# Patient Record
Sex: Female | Born: 1935 | Race: White | Hispanic: No | Smoking: Never smoker
Health system: Southern US, Community
[De-identification: ages and names within clinical notes are randomized; demographics above are authoritative.]

## PROBLEM LIST (undated history)

## (undated) DIAGNOSIS — I10 Essential (primary) hypertension: Secondary | ICD-10-CM

## (undated) DIAGNOSIS — E875 Hyperkalemia: Secondary | ICD-10-CM

## (undated) DIAGNOSIS — M1990 Unspecified osteoarthritis, unspecified site: Secondary | ICD-10-CM

## (undated) DIAGNOSIS — F0280 Dementia in other diseases classified elsewhere without behavioral disturbance: Secondary | ICD-10-CM

## (undated) DIAGNOSIS — G309 Alzheimer's disease, unspecified: Secondary | ICD-10-CM

## (undated) DIAGNOSIS — E785 Hyperlipidemia, unspecified: Secondary | ICD-10-CM

## (undated) DIAGNOSIS — K589 Irritable bowel syndrome without diarrhea: Secondary | ICD-10-CM

## (undated) HISTORY — PX: CARDIAC CATHETERIZATION: SHX172

## (undated) HISTORY — PX: ABDOMINAL HYSTERECTOMY: SHX81

---

## 2000-12-01 ENCOUNTER — Ambulatory Visit (HOSPITAL_COMMUNITY): Admission: RE | Admit: 2000-12-01 | Discharge: 2000-12-01 | Payer: Self-pay | Admitting: Ophthalmology

## 2000-12-01 ENCOUNTER — Encounter: Payer: Self-pay | Admitting: Ophthalmology

## 2001-03-18 ENCOUNTER — Encounter: Payer: Self-pay | Admitting: Neurosurgery

## 2001-03-18 ENCOUNTER — Encounter: Admission: RE | Admit: 2001-03-18 | Discharge: 2001-03-18 | Payer: Self-pay | Admitting: Neurosurgery

## 2001-03-28 ENCOUNTER — Ambulatory Visit (HOSPITAL_COMMUNITY): Admission: RE | Admit: 2001-03-28 | Discharge: 2001-03-28 | Payer: Self-pay | Admitting: Ophthalmology

## 2001-08-16 ENCOUNTER — Ambulatory Visit (HOSPITAL_COMMUNITY): Admission: RE | Admit: 2001-08-16 | Discharge: 2001-08-16 | Payer: Self-pay | Admitting: Ophthalmology

## 2001-09-13 ENCOUNTER — Ambulatory Visit (HOSPITAL_COMMUNITY): Admission: RE | Admit: 2001-09-13 | Discharge: 2001-09-13 | Payer: Self-pay | Admitting: Ophthalmology

## 2002-05-05 ENCOUNTER — Inpatient Hospital Stay (HOSPITAL_COMMUNITY): Admission: EM | Admit: 2002-05-05 | Discharge: 2002-05-10 | Payer: Self-pay | Admitting: Cardiology

## 2002-05-09 ENCOUNTER — Encounter: Payer: Self-pay | Admitting: Cardiology

## 2005-08-31 ENCOUNTER — Ambulatory Visit (HOSPITAL_COMMUNITY): Admission: RE | Admit: 2005-08-31 | Discharge: 2005-08-31 | Payer: Self-pay | Admitting: Ophthalmology

## 2006-07-02 ENCOUNTER — Ambulatory Visit: Payer: Self-pay | Admitting: Cardiology

## 2009-11-09 ENCOUNTER — Emergency Department (HOSPITAL_COMMUNITY): Admission: EM | Admit: 2009-11-09 | Discharge: 2009-11-09 | Payer: Self-pay | Admitting: Emergency Medicine

## 2009-11-11 ENCOUNTER — Emergency Department (HOSPITAL_COMMUNITY): Admission: EM | Admit: 2009-11-11 | Discharge: 2009-11-11 | Payer: Self-pay | Admitting: Emergency Medicine

## 2010-09-30 LAB — URINALYSIS, ROUTINE W REFLEX MICROSCOPIC
Bilirubin Urine: NEGATIVE
Glucose, UA: NEGATIVE mg/dL
Nitrite: NEGATIVE
Specific Gravity, Urine: 1.01 (ref 1.005–1.030)
pH: 6.5 (ref 5.0–8.0)

## 2010-09-30 LAB — URINE MICROSCOPIC-ADD ON

## 2010-09-30 LAB — GLUCOSE, CAPILLARY: Glucose-Capillary: 180 mg/dL — ABNORMAL HIGH (ref 70–99)

## 2010-10-10 ENCOUNTER — Emergency Department (HOSPITAL_COMMUNITY): Payer: Medicare Other

## 2010-10-10 ENCOUNTER — Emergency Department (HOSPITAL_COMMUNITY)
Admission: EM | Admit: 2010-10-10 | Discharge: 2010-10-10 | Disposition: A | Discharge status: Home / Self Care | Payer: Medicare Other | Attending: Emergency Medicine | Admitting: Emergency Medicine

## 2010-10-10 DIAGNOSIS — R112 Nausea with vomiting, unspecified: Secondary | ICD-10-CM | POA: Insufficient documentation

## 2010-10-10 DIAGNOSIS — R9431 Abnormal electrocardiogram [ECG] [EKG]: Secondary | ICD-10-CM | POA: Insufficient documentation

## 2010-10-10 LAB — COMPREHENSIVE METABOLIC PANEL
AST: 17 U/L (ref 0–37)
Albumin: 3.6 g/dL (ref 3.5–5.2)
Calcium: 9.2 mg/dL (ref 8.4–10.5)
Creatinine, Ser: 0.62 mg/dL (ref 0.4–1.2)
GFR calc Af Amer: >60 mL/min (ref >60)
Sodium: 135 mEq/L (ref 135–145)

## 2010-10-10 LAB — DIFFERENTIAL
Basophils Absolute: 0 10*3/uL (ref 0.0–0.1)
Basophils Relative: 1 % (ref 0–1)
Eosinophils Absolute: 0 10*3/uL (ref 0.0–0.7)
Monocytes Absolute: 0.6 10*3/uL (ref 0.1–1.0)
Monocytes Relative: 10 % (ref 3–12)
Neutrophils Relative %: 76 % (ref 43–77)

## 2010-10-10 LAB — CBC
MCH: 32.3 pg (ref 26.0–34.0)
MCHC: 33.8 g/dL (ref 30.0–36.0)
Platelets: 271 10*3/uL (ref 150–400)
RBC: 3.56 MIL/uL — ABNORMAL LOW (ref 3.87–5.11)

## 2010-10-10 LAB — POCT CARDIAC MARKERS
CKMB, poc: <1 ng/mL — ABNORMAL LOW (ref 1.0–8.0)
Troponin i, poc: <0.05 ng/mL (ref 0.00–0.09)

## 2010-10-28 ENCOUNTER — Emergency Department (HOSPITAL_COMMUNITY)
Admission: EM | Admit: 2010-10-28 | Discharge: 2010-10-28 | Disposition: A | Discharge status: Home / Self Care | Payer: Medicare Other | Attending: Emergency Medicine | Admitting: Emergency Medicine

## 2010-10-28 ENCOUNTER — Emergency Department (HOSPITAL_COMMUNITY): Payer: Medicare Other

## 2010-10-28 DIAGNOSIS — E119 Type 2 diabetes mellitus without complications: Secondary | ICD-10-CM | POA: Insufficient documentation

## 2010-10-28 DIAGNOSIS — R0602 Shortness of breath: Secondary | ICD-10-CM | POA: Insufficient documentation

## 2010-10-28 DIAGNOSIS — R05 Cough: Secondary | ICD-10-CM | POA: Insufficient documentation

## 2010-10-28 DIAGNOSIS — J209 Acute bronchitis, unspecified: Secondary | ICD-10-CM | POA: Insufficient documentation

## 2010-10-28 DIAGNOSIS — E785 Hyperlipidemia, unspecified: Secondary | ICD-10-CM | POA: Insufficient documentation

## 2010-10-28 DIAGNOSIS — I10 Essential (primary) hypertension: Secondary | ICD-10-CM | POA: Insufficient documentation

## 2010-10-28 DIAGNOSIS — Z79899 Other long term (current) drug therapy: Secondary | ICD-10-CM | POA: Insufficient documentation

## 2010-10-28 DIAGNOSIS — R059 Cough, unspecified: Secondary | ICD-10-CM | POA: Insufficient documentation

## 2010-10-28 LAB — CBC
HCT: 32 % — ABNORMAL LOW (ref 36.0–46.0)
Hemoglobin: 10.7 g/dL — ABNORMAL LOW (ref 12.0–15.0)
RBC: 3.34 MIL/uL — ABNORMAL LOW (ref 3.87–5.11)
RDW: 13.2 % (ref 11.5–15.5)
WBC: 3.1 10*3/uL — ABNORMAL LOW (ref 4.0–10.5)

## 2010-10-28 LAB — BASIC METABOLIC PANEL
CO2: 21 mEq/L (ref 19–32)
GFR calc non Af Amer: >60 mL/min (ref >60)
Glucose, Bld: 115 mg/dL — ABNORMAL HIGH (ref 70–99)
Potassium: 3.5 mEq/L (ref 3.5–5.1)
Sodium: 136 mEq/L (ref 135–145)

## 2010-10-28 LAB — CK TOTAL AND CKMB (NOT AT ARMC): Relative Index: 0.8 (ref 0.0–2.5)

## 2010-10-28 LAB — DIFFERENTIAL
Basophils Absolute: 0 10*3/uL (ref 0.0–0.1)
Lymphocytes Relative: 30 % (ref 12–46)
Neutro Abs: 1.8 10*3/uL (ref 1.7–7.7)
Neutrophils Relative %: 59 % (ref 43–77)

## 2010-10-28 LAB — INFLUENZA PANEL BY PCR (TYPE A & B)

## 2010-10-28 LAB — TROPONIN I: Troponin I: 0.01 ng/mL (ref 0.00–0.06)

## 2010-11-28 NOTE — Discharge Summary (Signed)
NAME:  Michelle Sweeney, Michelle Sweeney                          ACCOUNT NO.:  [id number]   MEDICAL RECORD NO.:  [id number]                   PATIENT TYPE:  INP   LOCATION:  3715                                 FACILITY:  [facility]   PHYSICIAN:  Lavella Hammock, P.A. [facility]            DATE OF BIRTH:  May 09, 1933   DATE OF ADMISSION:  05/05/2002  DATE OF DISCHARGE:  05/10/2002                           DISCHARGE SUMMARY - REFERRING   PROCEDURES:  1. Cardiac catheterization.  2. Coronary arteriogram.  3. Left ventriculogram.  4. Adenosine Cardiolite.   HOSPITAL COURSE:  The patient is a 77 year old female who had a negative  nuclear study in 1995, but who has multiple cardiac risk factors including  hypertension and hyperlipidemia and diabetes. She went to [facility] on May 04, 2002, for chest discomfort that had been worse over  the last 24 to 48 hours and began with exertion. She had been admitted there  to rule out MI and for further evaluation. Her cardiac consult was called as  well. Her symptoms were suspicious for coronary  ischemia, and it was felt  that she needed a cardiac catheterization to define her anatomy. Because of  this she was transferred to [facility] for further evaluation and  treatment.   Her enzymes were negative for MI and she had a cardiac catheterization  performed on May 09, 2002. The cardiac catheterization showed a left  main that was normal and an LAD that was heavily calcified with a proximal  60% to 70% stenosis. The circumflex was dominant with mild irregularities  and the RCA was nondominant with no critical disease. The films were  reviewed by Dr. Riley Kill and there was concern for ischemia from the LAD  region, and so an adenosine Cardiolite was ordered.   On May 09, 2002,  the patient had an adenosine Cardiolite. The  Cardiolite showed an EF of 78% with no ischemia and no scar. It was felt  that medical therapy was then best, and  she has had no further episodes of  chest pain with ambulation. A cholesterol profile was checked and it showed  an LDL of 128. Because of this she was started on  a statin.   The patient had been on insulin prior to  admission, but there was concern  for proper technique, so she was seen and proper technique was reviewed and  demonstrated. The patient demonstrated understanding.   On May 10, 2002, the patient was ambulating without difficulty and  followup had been arranged. She was considered suitable for discharge on  May 10, 2002.   LABORATORY DATA:  Hemoglobin 13.6, hematocrit 40.3, WBCs 6.2, platelets 304.  Glucose 181 fasting. Sodium 140, potassium 4.3, chloride 103, CO2 25, BUN  13, creatinine 0.7.  A lipid profile drawn at [facility] showed  triglycerides 130, total cholesterol 220, HDL 66, LDL 128.   CONDITION ON DISCHARGE:  Improved  DISCHARGE DIAGNOSES:  1. Chest pain, possible anginal pain, medical therapy recommended.  2. Coronary artery disease, 60% to 70% left anterior descending artery with     no ischemia by Cardiolite. No other critical disease by catheterization     this admission.  3. Insulin dependent diabetes mellitus.  4. History of allergy to penicillin.  5. Hypertension.  6. Hypertension.  7. Hyperlipidemia.  8. Status post hysterectomy.  9. History of paroxysmal atrial fibrillation and flutter.  10.      Hypokalemia.   DISCHARGE INSTRUCTIONS:  Her activity level is to include no driving, sexual  activity or strenuous activity for two days. After that she is to walk five  minutes twice a day starting Saturday and increase p.r.n. She is to stick to  a low fat, low salt diabetic diet. She is to call  the office for problems  with the catheter site.   FOLLOW UP:  She is to follow up with Dr. Myrtis Ser and the office will call. She  is to follow up with Dr. Orvan Falconer and keep appointments as scheduled with  him. The patient is to have  laboratory work drawn at Dr. Daleen Bo office on  May 15, 2002. At that time she is to obtain a BMET to assess for ongoing  hypokalemia.   DISCHARGE MEDICATIONS:  1. Insulin 70/30, 12 units at breakfast and lunch and 10 units q. p.m.  2. Enteric coated aspirin 325 mg q.d.  3. Atenolol 12.5 mg which is 1/2 of a 25 mg tablet b.i.d.  4. Altace 5 mg q.d.  5. Tiazac 180 mg q.d.  6. Lasix 40 mg q.d.  7. Zocor 20 mg q.d.  8. Nitroglycerin p.r.n.                                                   Lavella Hammock, P.A. [facility]    RG/MEDQ  D:  05/10/2002  T:  05/10/2002  Job:  [id number]   cc:   [name]  [address] [place]  [place] [address]  Fax: [phone]

## 2010-11-28 NOTE — Op Note (Signed)
[place]  Patient:    Michelle Sweeney, Michelle Sweeney                       MRN: [id number] Proc. Date: 12/01/00 Adm. Date:  [id number] Disc. Date: [id number] Attending:  Ernesto Rutherford                           Operative Report  PREOPERATIVE DIAGNOSES: 1. Dense vitreous hemorrhage - right eye. 2. Nuclear sclerotic cataract - right eye.  POSTOPERATIVE DIAGNOSES: 1. Dense vitreous hemorrhage - right eye. 2. Nuclear sclerotic cataract - right eye.  PROCEDURES: 1. Posterior vitrectomy and endolaser panphotocoagulation - right eye. 2. Pars plana phacofragmentation - right eye. 3. Insertion of posterior chamber intraocular lens - primary - right eye.  SURGEON:  Ernesto Rutherford, M.D.  ANESTHESIA:  Local retrobulbar and monitored anesthesia control.  INDICATION FOR PROCEDURE:   The patient is a 77 year old woman with profound vision loss on the basis of advanced diabetic retinopathy and advanced nuclear sclerotic cataract.   She understands this is an attempt to induce quiescence of diabetic retinopathy, as well as to remove vitreous opacification and cataract opacification and attempt to remove all the anterior hyaloid cataract to be removed and intraocular lens used in its place.  Patient understands the risks of anesthesia, including the rare occurrence of death, loss of the eye, including  hemorrhage, infection, scarring, need for further surgery, no change in vision, loss of vision, and progression of disease despite intervention.  Appropriate signed consent was obtained.  DESCRIPTION OF PROCEDURE:  The patient was taken to the operating room.  In the operating room, appropriate monitoring, followed by mild sedation with 0.75% Marcaine delivered 5 cc retrobulbar to the right eye, followed by an additional 5 cc laterally in the fascia moderate Darel Hong.  The right periocular region was sterilely prepped and draped in usual ophthalmic fashion.  Lid  speculum applied.  Conjunctival peritomies fashioned superiorly and temporally.  A 4 mm infusion was secured 3.5 mm posterior to the limbus in the inferotemporal quadrant.  Placement in the vitreous cavity verified visually.  Superior sclerotomies then fashioned.  Wild microscope placed in position.  Grooved limbal incision was then fashioned with a crescent blade. Hemostasis was spontaneous.  Superior sclerotomy was then placed.  The microscope placed in position.  Core vitrectomy was then begun.  It was necessary to perform a posterior capsulotomy, hydrodissection and delamination of the lens was carried out in the bag and endocapsular phacofragmentation was then carried out without difficulty.  Cortical clean up was then carried out with the vitrectomy instrumentation.  This allowed for removal of vitreous hemorrhage and elevation of the posterior hyaloid and iatrogenic removal and vitreous skirt trimmed 360 degrees.  ______ now clear.  Endolaser photocoagulation was carried out peripherally in a fill-in type applications. At this time, the intraocular pressure was lowered.  Anterior chamber was entered, deepened, and then deepened with Provisc.  The capsular bag was opened and Provisc.  The model EZE-60 power plus 23.0 plus 23.0, serial number [id number] was then placed into the sulcus, rotated in a horizontal position in excellent position.  The limbal wound was then closed with interrupted 10-0 nylon sutures.  Provisc was aspirated from the anterior chamber.  Excellent ______ was obtained.  At this time, superior sclerotomies were then closed with 7-0 Vicryl suture.  Following this, examination revealed that everything was that the  ______ was clear.  The infusion removed and similarly closed with 7-0 Vicryl suture.  Conjunctiva then closed with 7-0 Vicryl suture. Subconjunctival injection of antibiotics and steroid applied.  The patient tolerated the procedure well without complication and  taken to recovery room in good and stable condition. DD:  01/05/01 TD:  01/05/01 Job: [id number]

## 2010-11-28 NOTE — Op Note (Signed)
. [facility]  Patient:    Michelle Sweeney, Michelle Sweeney Visit Number: [id number] MRN: [id number]          Service Type: DSU Location: [facility] 2550 08 Attending Physician:  Ernesto Rutherford Dictated by:   Ernesto Rutherford, M.D. Proc. Date: 03/28/01 Admit Date:  03/28/2001 Discharge Date: 03/28/2001                             Operative Report  PREOPERATIVE DIAGNOSES: 1. Dense vitreous hemorrhage, left eye, nonclearing. 2. Progressive proliferative diabetic retinopathy, left eye, despite previous    panphotocoagulation. 3. Nuclear sclerotic cataract, left eye.  PROCEDURES: 1. Posterior vitrectomy with Endolaser panphotocoagulation, left eye. 2. Pars plana phacofragmentation of the lens endocapsularly. 3. Insertion of posterior chamber intraocular lens into the sulcus, model    EZE-60 lens, power +24.0, left eye.  INDICATION FOR PROCEDURE:  The patient is a 77 year old woman with profound vision loss in this, her best-sighted eye, in the left eye.  She has had a consistent hand motion vision in the left eye.  This is an attempt to remove the medial opacity as well as to remove the entire vitreous scaffold, best accomplished by removal of the nuclear sclerotic cataract and delivery of Endolaser.  The patient understands the risks of anesthesia, including the rare occurrence of death, and loss to the eye, including hemorrhage, infection, scarring, need for further surgery, no change in vision, loss of vision, and progression of disease despite intervention.  DESCRIPTION OF PROCEDURE:  After the appropriate signed consent was obtained, the patient taken to the operating room.  In the operating room, appropriate monitoring followed by mild sedation.  Marcaine 0.75% delivered 5 cc laterally in the fashion of modified Darel Hong as well as retrobulbar 5 cc.  The left periocular region was sterilely prepped and draped in the usual ophthalmic fashion.  A lid speculum  applied.  A conjunctival peritomy fashioned temporally and superonasally.  A 4 mm _____ infusion was then secured 3.5 mm posterior to the limbus in the inferotemporal quadrant.  Placement in the vitreous cavity verified visually.  Superior sclerotomy was then fashioned. Wall microscope placed in position with BIOM attached.  Core vitrectomy was then begun.  Dense vitreous opacification was removed in this fashion.  It was necessary to remove the lens.  Posterior capsule was entered.  Delineation and delamination was then carried out in the bag.  Phacofragmentation was then carried out in the bag.  Cortical cleanup was then carried out in the bag.  No complications occurred with this portion of the procedure.  Grooved limbal incision fashioned.  The anterior chamber having been deepened previously and prior to the phacofragmentation with Provisc.  Paracentesis in the incision in the anterior chamber of the cornea.  At this time, excellent visualization of the posterior pole and peripheral retina was obtained.  Peripheral vitreous skirt was then trimmed 360 degrees. Endolaser photocoagluation was then carried out 360 degrees.  At this time the infusion was turned on very low.  The anterior chamber was entered with an MVR blade in a grooved incision.  The model EZE-60 lens was then placed directly into the sulcus _____ to the horizontal meridian with the Sinskey hook. Excellent support was noted.  Central capsule was clear.  At this time the corneal limbal wound was closed with interrupted 10-0 nylon suture and the knots buried by rotation.  Excellent wound closure was obtained with four  interrupted 10-0 nylon sutures.  At this time, previous to this, all Provisc had been irrigated free of the anterior chamber.  At this time superior sclerotomies were then closed with 7-0 Vicryl suture.  The infusion removed and sclerotomy closed with 7-0 Vicryl suture.  Conjunctiva closed with 7-0 Vicryl  suture.  Subconjunctival injection of antibiotic and steroid applied. The patient tolerated the procedure well without complications. Dictated by:   Ernesto Rutherford, M.D. Attending Physician:  Ernesto Rutherford DD:  03/28/01 TD:  03/29/01 Job: [id number]

## 2010-11-28 NOTE — Cardiovascular Report (Signed)
NAME:  Michelle Sweeney, Michelle Sweeney                          ACCOUNT NO.:  [id number]   MEDICAL RECORD NO.:  [id number]                   PATIENT TYPE:  INP   LOCATION:  3715                                 FACILITY:  [facility]   PHYSICIAN:  Arturo Morton. Riley Kill, M.D. [facility]         DATE OF BIRTH:  March 23, 1933   DATE OF PROCEDURE:  05/12/2002  DATE OF DISCHARGE:  05/10/2002                              CARDIAC CATHETERIZATION   INDICATIONS:  Chest pain.   PROCEDURES:  1. Left heart catheterization.  2. Selective coronary arteriography.  3. Selective left ventriculography.  4. Proximal root aortography.   DESCRIPTION OF PROCEDURE:  The procedure was begun in catheterization lab  #7.  Catheterization lab #7 went down after taking pictures of the left  coronary artery.  The case had to be moved to another laboratory where the  procedure was subsequently performed to completion without difficulty.  The  patient was taken to the holding area in satisfactory clinical condition.   HEMODYNAMIC DATA:  1. Central aortic pressure 134/58, mean 88.  2. Left ventricular pressure 133/16.  3. No gradient noted on pullback across the aortic valve.   ANGIOGRAPHIC DATA:  1. Ventriculography revealed vigorous global systolic function.  Ejection     fraction was normal.  The aortic leaflets were not well seen, but     appeared to open.  2. Aortic root shot demonstrates some mild calcification of the proximal     aortic root.  There is no definite evidence of aortic regurgitation. The     aortic valve appears to be trileaflet with normal leaflet motion.  3. The left main coronary artery is mildly calcified.  There is slight     tapering in the left main but no significant focal obstruction.  4. The left anterior descending artery courses to the apex.  Importantly,     the left anterior descending is calcified proximally.  There is     approximately 50-70% proximal stenosis although this appears to be     relatively  smooth and in comparison to the diagnostic catheter, appears     to be probably up to 2 mm.  Whether this is flow-limiting is unclear. The mid and distal portions of the  LAD are without critical disease.  1. There is a small ramus intermedius which is free of critical disease.  2. The circumflex is a large caliber vessel that is dominant. There is a     proximal marginal branch which was free of critical disease. The second     and third marginal branches which are free of critical disease as well.     The posterior descending appears to fill well and without significant     narrowing.  3. The right coronary artery is nondominant. There is calcification at the     ostium but no significant critical narrowing.   CONCLUSION:  1. Preserved left ventricular function.  2. No  definite aortic regurgitation or evidence of dissection.  3. A 50-70% stenosis of the proximal left anterior descending and calcified     vessel.  4. Dominant circumflex system without critical narrowing.  5. Nondominant right coronary artery.   DISPOSITION:  A radionuclide imaging may be important to try to determine if  there is LAD ischemia.  If so, some form of revascularization would likely  be recommended.  Continued management by the [facility] Cardiology Team will be  undertaken.                                                    Arturo Morton. Riley Kill, M.D. [facility]    TDS/MEDQ  D:  05/12/2002  T:  05/13/2002  Job:  [id number]   cc:   Willa Rough, M.D. [facility]   CV Laboratory

## 2011-06-30 ENCOUNTER — Other Ambulatory Visit: Payer: Self-pay

## 2011-06-30 ENCOUNTER — Emergency Department (HOSPITAL_COMMUNITY): Payer: Medicare Other

## 2011-06-30 ENCOUNTER — Emergency Department (HOSPITAL_COMMUNITY)
Admission: EM | Admit: 2011-06-30 | Discharge: 2011-06-30 | Disposition: A | Discharge status: Home / Self Care | Payer: Medicare Other | Attending: Emergency Medicine | Admitting: Emergency Medicine

## 2011-06-30 ENCOUNTER — Encounter: Payer: Self-pay | Admitting: Emergency Medicine

## 2011-06-30 DIAGNOSIS — Z79899 Other long term (current) drug therapy: Secondary | ICD-10-CM | POA: Insufficient documentation

## 2011-06-30 DIAGNOSIS — IMO0002 Reserved for concepts with insufficient information to code with codable children: Secondary | ICD-10-CM | POA: Insufficient documentation

## 2011-06-30 DIAGNOSIS — M542 Cervicalgia: Secondary | ICD-10-CM | POA: Insufficient documentation

## 2011-06-30 DIAGNOSIS — T148XXA Other injury of unspecified body region, initial encounter: Secondary | ICD-10-CM

## 2011-06-30 DIAGNOSIS — Y9210 Unspecified residential institution as the place of occurrence of the external cause: Secondary | ICD-10-CM | POA: Insufficient documentation

## 2011-06-30 DIAGNOSIS — M545 Low back pain, unspecified: Secondary | ICD-10-CM | POA: Insufficient documentation

## 2011-06-30 DIAGNOSIS — E119 Type 2 diabetes mellitus without complications: Secondary | ICD-10-CM | POA: Insufficient documentation

## 2011-06-30 DIAGNOSIS — I10 Essential (primary) hypertension: Secondary | ICD-10-CM | POA: Insufficient documentation

## 2011-06-30 DIAGNOSIS — W19XXXA Unspecified fall, initial encounter: Secondary | ICD-10-CM | POA: Insufficient documentation

## 2011-06-30 DIAGNOSIS — E785 Hyperlipidemia, unspecified: Secondary | ICD-10-CM | POA: Insufficient documentation

## 2011-06-30 DIAGNOSIS — R51 Headache: Secondary | ICD-10-CM | POA: Insufficient documentation

## 2011-06-30 HISTORY — DX: Hyperkalemia: E87.5

## 2011-06-30 HISTORY — DX: Essential (primary) hypertension: I10

## 2011-06-30 HISTORY — DX: Hyperlipidemia, unspecified: E78.5

## 2011-06-30 HISTORY — DX: Irritable bowel syndrome, unspecified: K58.9

## 2011-06-30 MED ORDER — DOUBLE ANTIBIOTIC 500-10000 UNIT/GM EX OINT
TOPICAL_OINTMENT | CUTANEOUS | Status: AC
  Filled 2011-06-30: qty 1

## 2011-06-30 MED ORDER — DOUBLE ANTIBIOTIC 500-10000 UNIT/GM EX OINT: TOPICAL_OINTMENT | Freq: Once | CUTANEOUS | Status: DC

## 2011-06-30 NOTE — ED Notes (Signed)
Family at bedside. Patient is comfortable does not need anything at this time. 

## 2011-06-30 NOTE — ED Provider Notes (Signed)
History   This chart was scribed for Michelle Octave, MD by Clarita Crane. The patient was seen in room APA11/APA11 and the patient's care was started at 9:00am.   CSN: [id number] Arrival date & time: 06/30/2011  8:41 AM   First MD Initiated Contact with Patient 06/30/11 0857      Chief Complaint  Patient presents with  . Fall    (Consider location/radiation/quality/duration/timing/severity/associated sxs/prior treatment) HPI Michelle Sweeney is a 77 y.o. female who presents to the Emergency Department complaining of constant, moderate pain in her neck and lower back associated with a fall this morning after she tripped. Patient has a small abrasion to her right cheek associated with the fall. She denies LOC, chest pain, abdominal pain, syncope and leg pain. She was not able to ambulate after the fall, and the family member believes she was helped up by a [employer].   Past Medical History  Diagnosis Date  . IBS (irritable bowel syndrome)   . HTN (hypertension)   . Osteoporosis   . Hyperlipidemia   . Hyperkalemia   . Diabetes mellitus     History reviewed. No pertinent past surgical history.  No family history on file.  History  Substance Use Topics  . Smoking status: Never Smoker   . Smokeless tobacco: Not on file  . Alcohol Use: No    OB History    Grav Para Term Preterm Abortions TAB SAB Ect Mult Living                  Review of Systems  Constitutional: Negative for fatigue.  HENT: Positive for neck pain. Negative for congestion, sinus pressure and ear discharge.   Eyes: Negative for discharge.  Respiratory: Negative for cough.   Cardiovascular: Negative for chest pain.  Gastrointestinal: Negative for abdominal pain and diarrhea.  Genitourinary: Negative for frequency.  Musculoskeletal: Positive for back pain.  Skin: Positive for wound (small abrasion to right cheek). Negative for rash.  Neurological: Negative for seizures and syncope.    Hematological: Negative.   Psychiatric/Behavioral: Negative for hallucinations.    Allergies  Penicillins and Quinine derivatives  Home Medications   Current Outpatient Rx  Name Route Sig Dispense Refill  . ACETAMINOPHEN 500 MG PO TABS Oral Take 1,000 mg by mouth every 6 (six) hours as needed. pain     . ALBUTEROL SULFATE HFA 108 (90 BASE) MCG/ACT IN AERS Inhalation Inhale 2 puffs into the lungs every 6 (six) hours as needed. wheezing     . ALPRAZOLAM 1 MG PO TABS Oral Take 1 mg by mouth 3 (three) times daily.      Marland Kitchen AMITRIPTYLINE HCL 10 MG PO TABS Oral Take 10 mg by mouth at bedtime.      . ASPIRIN EC 81 MG PO TBEC Oral Take 81 mg by mouth daily.      Marland Kitchen BIMATOPROST 0.03 % OP SOLN Both Eyes Place 1 drop into both eyes at bedtime.      Marland Kitchen CALCIUM CARBONATE-VITAMIN D 250-125 MG-UNIT PO TABS Oral Take 1 tablet by mouth 2 (two) times daily.      Marland Kitchen VITAMIN D 1000 UNITS PO TABS Oral Take 1,000 Units by mouth daily.      Marland Kitchen CITALOPRAM HYDROBROMIDE 20 MG PO TABS Oral Take 20 mg by mouth daily.      Marland Kitchen GLUCOSE 40 % PO GEL Oral Take 1 Tube by mouth once as needed. When blood sugar is 60     .  FUROSEMIDE 20 MG PO TABS Oral Take 20 mg by mouth daily.      . GUAIFENESIN 400 MG PO TABS Oral Take 400 mg by mouth every 4 (four) hours. cough     . INSULIN GLARGINE 100 UNIT/ML [place] SOLN Subcutaneous Inject 10 Units into the skin at bedtime.      Marland Kitchen FLORAJEN ACIDOPHILUS PO CAPS Oral Take 1 capsule by mouth daily.      Marland Kitchen LISINOPRIL 5 MG PO TABS Oral Take 5 mg by mouth daily.      Marland Kitchen LOPERAMIDE HCL 2 MG PO CAPS Oral Take 4 mg by mouth 4 (four) times daily as needed. Loose stool     . LORATADINE 10 MG PO TABS Oral Take 10 mg by mouth daily as needed. allergies     . LOVASTATIN 20 MG PO TABS Oral Take 20 mg by mouth at bedtime.      Marland Kitchen POLYETHYL GLYCOL-PROPYL GLYCOL 0.4-0.3 % OP SOLN Right Eye Place 1 drop into the right eye 3 (three) times daily.      Marland Kitchen POTASSIUM CHLORIDE 20 MEQ PO PACK Oral Take 20 mEq by mouth  daily.      Marland Kitchen PROMETHAZINE HCL 25 MG PO TABS Oral Take 25 mg by mouth every 6 (six) hours as needed. nausea     . RIVASTIGMINE 9.5 MG/24HR TD PT24 Transdermal Place 1 patch onto the skin daily.      Marland Kitchen SIMVASTATIN 80 MG PO TABS Oral Take 80 mg by mouth 3 (three) times daily as needed. indigestion       BP 123/61  Pulse 59  Temp(Src) 98 F (36.7 C) (Oral)  Resp 18  Ht 5' 3.5" (1.613 m)  Wt 150 lb (68.04 kg)  BMI 26.15 kg/m2  SpO2 100%  Physical Exam  Nursing note and vitals reviewed. Constitutional: She is oriented to person, place, and time. She appears well-developed and well-nourished. No distress.  HENT:  Head: Normocephalic and atraumatic.       TM clear bilaterally. No malocclusion of jaw. No hemotympanum. No cephalohematoma.   Eyes: EOM are normal. Pupils are equal, round, and reactive to light.  Neck: Neck supple. No tracheal deviation present.  Cardiovascular: Normal rate, regular rhythm and normal heart sounds.  Exam reveals no gallop and no friction rub.   No murmur heard.      +2 radial, femoral, DP pulses  Pulmonary/Chest: Effort normal and breath sounds normal. No respiratory distress. She has no wheezes.  Abdominal: Soft. She exhibits no distension.  Musculoskeletal: Normal range of motion. She exhibits no edema.       Spine non-tender. L-spine non-tender. Bilateral hips non-tender. Pelvis stable.   Neurological: She is alert and oriented to person, place, and time. No sensory deficit. GCS eye subscore is 4. GCS verbal subscore is 5. GCS motor subscore is 6.  Skin: Skin is warm and dry.       Abrasion to right cheek.   Psychiatric: She has a normal mood and affect. Her behavior is normal.    ED Course  Procedures (including critical care time)  DIAGNOSTIC STUDIES: Oxygen Saturation is 98% on room air, normal by my interpretation.    COORDINATION OF CARE:  10:40am-Recheck: C-collar removed, pt informed of planned discharge.   Labs Reviewed - No data to  display Dg Chest 2 View  06/30/2011  *RADIOLOGY REPORT*  Clinical Data: Trauma secondary to a fall today.  CHEST - 2 VIEW  Comparison: 10/28/2010  Findings: Heart size  and vascularity are normal and the lungs are clear.  Old mild compression fractures of T11 and T12.  No acute osseous abnormality.  IMPRESSION: No acute disease in the chest.  Original Report Authenticated By: Gwynn Burly, M.D.   Ct Head Wo Contrast  06/30/2011  *RADIOLOGY REPORT*  Clinical Data:  Fall, abrasion right cheek, hypertension, diabetes, patient does not remember where happened  CT HEAD WITHOUT CONTRAST CT CERVICAL SPINE WITHOUT CONTRAST  Technique:  Multidetector CT imaging of the head and cervical spine was performed following the standard protocol without intravenous contrast.  Multiplanar CT image reconstructions of the cervical spine were also generated.  Comparison:  11/09/2009  CT HEAD  Findings: Generalized atrophy. Normal ventricular morphology. No midline shift or mass effect. Small vessel chronic ischemic changes of deep cerebral white matter. No intracranial hemorrhage, mass lesion, or acute infarction. Visualized paranasal sinuses and mastoid air cells clear. Bones demineralized. Atherosclerotic calcification of internal carotid and vertebral arteries at skull base.  IMPRESSION: Atrophy with small vessel chronic ischemic changes of deep cerebral white matter. No acute intracranial abnormalities.  CT CERVICAL SPINE  Findings: Atherosclerotic calcifications within the carotid arterial systems in the neck bilaterally. Visualized skull base intact. Diffuse osseous demineralization. Multilevel disc space narrowing endplate spur formation. Diffuse facet degenerative changes throughout the cervical spine. Prevertebral soft tissues normal thickness. Vertebral body heights maintained without fracture or subluxation. Lung apices clear. No acute bony abnormalities identified.  IMPRESSION: Multilevel degenerative disc and facet  disease changes of the cervical spine. Osseous demineralization. No acute cervical spine abnormalities.  Original Report Authenticated By: Lollie Marrow, M.D.   Ct Cervical Spine Wo Contrast  06/30/2011  *RADIOLOGY REPORT*  Clinical Data:  Fall, abrasion right cheek, hypertension, diabetes, patient does not remember where happened  CT HEAD WITHOUT CONTRAST CT CERVICAL SPINE WITHOUT CONTRAST  Technique:  Multidetector CT imaging of the head and cervical spine was performed following the standard protocol without intravenous contrast.  Multiplanar CT image reconstructions of the cervical spine were also generated.  Comparison:  11/09/2009  CT HEAD  Findings: Generalized atrophy. Normal ventricular morphology. No midline shift or mass effect. Small vessel chronic ischemic changes of deep cerebral white matter. No intracranial hemorrhage, mass lesion, or acute infarction. Visualized paranasal sinuses and mastoid air cells clear. Bones demineralized. Atherosclerotic calcification of internal carotid and vertebral arteries at skull base.  IMPRESSION: Atrophy with small vessel chronic ischemic changes of deep cerebral white matter. No acute intracranial abnormalities.  CT CERVICAL SPINE  Findings: Atherosclerotic calcifications within the carotid arterial systems in the neck bilaterally. Visualized skull base intact. Diffuse osseous demineralization. Multilevel disc space narrowing endplate spur formation. Diffuse facet degenerative changes throughout the cervical spine. Prevertebral soft tissues normal thickness. Vertebral body heights maintained without fracture or subluxation. Lung apices clear. No acute bony abnormalities identified.  IMPRESSION: Multilevel degenerative disc and facet disease changes of the cervical spine. Osseous demineralization. No acute cervical spine abnormalities.  Original Report Authenticated By: Lollie Marrow, M.D.     1. Fall   2. Abrasion       MDM  Mechanical fall from  nursing home after hitting head against a dresser. Patient denies loss of consciousness, chest pain, shortness of breath, lightheadedness or dizziness. She complains of pain to her chin and right cheek.  Imaging studies negative for acute traumatic injury.  She is at her neurological baseline and denies complaints. Is stable for outpatient followup.  I personally performed the services described in this documentation,  which was scribed in my presence.  The recorded information has been reviewed and considered.   Date: 06/30/2011  Rate: 59  Rhythm: sinus bradycardia  QRS Axis: normal  Intervals: normal  ST/T Wave abnormalities: nonspecific ST/T changes  Conduction Disutrbances:none  Narrative Interpretation:   Old EKG Reviewed: unchanged         Michelle Octave, MD 06/30/11 1747

## 2011-06-30 NOTE — ED Notes (Signed)
Patient returned from imaging at this time. NAD noted.

## 2011-06-30 NOTE — ED Notes (Signed)
Patient states she is just sore and is not hurting. Patient states she does not need anything at this time.

## 2011-06-30 NOTE — ED Notes (Signed)
Patient arrives via EMS from [facility] with c/o fall. Patient was getting out of bed this morning, lost balance and fell, striking dresser. Small laceration to right cheek and c/o neck and lower back pain. Denies LOC.

## 2011-07-02 ENCOUNTER — Emergency Department (HOSPITAL_COMMUNITY): Payer: Medicare Other

## 2011-07-02 ENCOUNTER — Encounter (HOSPITAL_COMMUNITY): Payer: Self-pay

## 2011-07-02 ENCOUNTER — Emergency Department (HOSPITAL_COMMUNITY)
Admission: EM | Admit: 2011-07-02 | Discharge: 2011-07-02 | Disposition: A | Discharge status: Home / Self Care | Payer: Medicare Other | Attending: Emergency Medicine | Admitting: Emergency Medicine

## 2011-07-02 DIAGNOSIS — Z79899 Other long term (current) drug therapy: Secondary | ICD-10-CM | POA: Insufficient documentation

## 2011-07-02 DIAGNOSIS — K589 Irritable bowel syndrome without diarrhea: Secondary | ICD-10-CM | POA: Insufficient documentation

## 2011-07-02 DIAGNOSIS — M25519 Pain in unspecified shoulder: Secondary | ICD-10-CM | POA: Insufficient documentation

## 2011-07-02 DIAGNOSIS — M810 Age-related osteoporosis without current pathological fracture: Secondary | ICD-10-CM | POA: Insufficient documentation

## 2011-07-02 DIAGNOSIS — S42402A Unspecified fracture of lower end of left humerus, initial encounter for closed fracture: Secondary | ICD-10-CM

## 2011-07-02 DIAGNOSIS — E785 Hyperlipidemia, unspecified: Secondary | ICD-10-CM | POA: Insufficient documentation

## 2011-07-02 DIAGNOSIS — M25529 Pain in unspecified elbow: Secondary | ICD-10-CM | POA: Insufficient documentation

## 2011-07-02 DIAGNOSIS — E119 Type 2 diabetes mellitus without complications: Secondary | ICD-10-CM | POA: Insufficient documentation

## 2011-07-02 DIAGNOSIS — I10 Essential (primary) hypertension: Secondary | ICD-10-CM | POA: Insufficient documentation

## 2011-07-02 MED ORDER — OXYCODONE-ACETAMINOPHEN 5-325 MG PO TABS: 1.0000 | ORAL_TABLET | ORAL | Status: DC | PRN

## 2011-07-02 MED ORDER — OXYCODONE-ACETAMINOPHEN 5-325 MG PO TABS
1.0000 | ORAL_TABLET | Freq: Once | ORAL | Status: AC
  Administered 2011-07-02: 1 via ORAL
  Filled 2011-07-02: qty 1

## 2011-07-02 MED ORDER — MORPHINE SULFATE 2 MG/ML IJ SOLN
2.0000 mg | Freq: Once | INTRAMUSCULAR | Status: AC
  Administered 2011-07-02: 2 mg via INTRAVENOUS
  Filled 2011-07-02: qty 1

## 2011-07-02 NOTE — ED Notes (Signed)
Pt was getting oob this am, slipped and fell, now having left elbow pain, deformity noted, +cap refill, +pulses.  Able to move fingers on command, denies any loc or other injury

## 2011-07-02 NOTE — ED Notes (Signed)
Pt woke from sleep, by room-mate, thought it was time to get up and fell into floor, now c/o left elbow pain.  Denies loc.

## 2011-07-02 NOTE — ED Notes (Signed)
Ambulated to bathroom with assistance  

## 2011-07-02 NOTE — ED Provider Notes (Signed)
History     CSN: [id number] Arrival date & time: 07/02/2011  6:13 AM   First MD Initiated Contact with Patient 07/02/11 [phone]      Chief Complaint  Patient presents with  . Fall    left elbow pain     Patient is a 77 y.o. female presenting with fall. The history is provided by the patient.  Fall The accident occurred less than 1 hour ago. Incident: while getting out of bed. Point of impact: left forearm. Pain location: left upper extremity. The pain is mild. Pertinent negatives include no loss of consciousness. The symptoms are aggravated by use of the injured limb. Treatments tried: splint.  Pt reports she fell getting out of bed She did not hit her head No LOC No neck/back pain Reports she landed on left UE and this is painful She denies cp/sob/abd pain Denies HA Past Medical History  Diagnosis Date  . IBS (irritable bowel syndrome)   . HTN (hypertension)   . Osteoporosis   . Hyperlipidemia   . Hyperkalemia   . Diabetes mellitus     History reviewed. No pertinent past surgical history.  No family history on file.  History  Substance Use Topics  . Smoking status: Never Smoker   . Smokeless tobacco: Not on file  . Alcohol Use: No    OB History    Grav Para Term Preterm Abortions TAB SAB Ect Mult Living                  Review of Systems  Neurological: Negative for loss of consciousness.  All other systems reviewed and are negative.    Allergies  Penicillins and Quinine derivatives  Home Medications   Current Outpatient Rx  Name Route Sig Dispense Refill  . ACETAMINOPHEN 500 MG PO TABS Oral Take 1,000 mg by mouth every 6 (six) hours as needed. pain     . ALBUTEROL SULFATE HFA 108 (90 BASE) MCG/ACT IN AERS Inhalation Inhale 2 puffs into the lungs every 6 (six) hours as needed. wheezing     . ALPRAZOLAM 1 MG PO TABS Oral Take 1 mg by mouth 3 (three) times daily.      Marland Kitchen AMITRIPTYLINE HCL 10 MG PO TABS Oral Take 10 mg by mouth at bedtime.      . ASPIRIN  EC 81 MG PO TBEC Oral Take 81 mg by mouth daily.      Marland Kitchen BIMATOPROST 0.03 % OP SOLN Both Eyes Place 1 drop into both eyes at bedtime.      Marland Kitchen CALCIUM CARBONATE-VITAMIN D 250-125 MG-UNIT PO TABS Oral Take 1 tablet by mouth 2 (two) times daily.      Marland Kitchen VITAMIN D 1000 UNITS PO TABS Oral Take 1,000 Units by mouth daily.      Marland Kitchen CITALOPRAM HYDROBROMIDE 20 MG PO TABS Oral Take 20 mg by mouth daily.      Marland Kitchen GLUCOSE 40 % PO GEL Oral Take 1 Tube by mouth once as needed. When blood sugar is 60     . FUROSEMIDE 20 MG PO TABS Oral Take 20 mg by mouth daily.      . GUAIFENESIN 400 MG PO TABS Oral Take 400 mg by mouth every 4 (four) hours. cough     . INSULIN GLARGINE 100 UNIT/ML [place] SOLN Subcutaneous Inject 10 Units into the skin at bedtime.      Marland Kitchen FLORAJEN ACIDOPHILUS PO CAPS Oral Take 1 capsule by mouth daily.      Marland Kitchen  LISINOPRIL 5 MG PO TABS Oral Take 5 mg by mouth daily.      Marland Kitchen LOPERAMIDE HCL 2 MG PO CAPS Oral Take 4 mg by mouth 4 (four) times daily as needed. Loose stool     . LORATADINE 10 MG PO TABS Oral Take 10 mg by mouth daily as needed. allergies     . LOVASTATIN 20 MG PO TABS Oral Take 20 mg by mouth at bedtime.      Marland Kitchen POLYETHYL GLYCOL-PROPYL GLYCOL 0.4-0.3 % OP SOLN Right Eye Place 1 drop into the right eye 3 (three) times daily.      Marland Kitchen POTASSIUM CHLORIDE 20 MEQ PO PACK Oral Take 20 mEq by mouth daily.      Marland Kitchen PROMETHAZINE HCL 25 MG PO TABS Oral Take 25 mg by mouth every 6 (six) hours as needed. nausea     . RIVASTIGMINE 9.5 MG/24HR TD PT24 Transdermal Place 1 patch onto the skin daily.      Marland Kitchen SIMVASTATIN 80 MG PO TABS Oral Take 80 mg by mouth 3 (three) times daily as needed. indigestion       BP 120/51  Pulse 60  Temp(Src) 98.5 F (36.9 C) (Oral)  Resp 20  Ht 5\' 2"  (1.575 m)  Wt 150 lb (68.04 kg)  BMI 27.44 kg/m2  SpO2 99%  Physical Exam  CONSTITUTIONAL: Well developed/well nourished HEAD AND FACE: Normocephalic/atraumatic EYES: EOMI/PERRL ENMT: Mucous membranes moist NECK: supple no  meningeal signs SPINE:Cspine nontender CV:  no murmurs/rubs/gallops noted LUNGS: Lungs are clear to auscultation bilaterally, no apparent distress ABDOMEN: soft, nontender, no rebound or guarding GU:no cva tenderness NEURO: Pt is awake/alert, moves all extremitiesx4, GCS 15 Distal motor/sensory intact left UE EXTREMITIES: tenderness with ROM of left elbow/wrist. No deformity  She is also point tender to left shoulder but no bruising noted.   Distal pulses intact on left LE Small abrasion to ulnar aspect of left wrist No tenderness with ROM of either hip/knee SKIN: warm, color normal PSYCH: no abnormalities of mood noted    ED Course  Procedures    Labs Reviewed  GLUCOSE, CAPILLARY    7:56 AM Plan is to immobilize left UE and arrange f/u with orthopedics  MDM  Nursing notes reviewed and considered in documentation xrays reviewed and considered         Joya Gaskins, MD 07/02/11 [phone]

## 2011-07-02 NOTE — ED Provider Notes (Signed)
77 yo F, s/p fall.  Resident of [employer] [facility].  Apparently hx of falls.  +left humeral fx today with distal NMS intact left hand.  No local Ortho MD.  T/C to Ortho Dr. Romeo Apple, case discussed, including:  HPI, pertinent PM/SHx, VS/PE, dx testing, ED course and treatment.  Agreeable to f/u in ofc.  Pt has ambulated with ED Tech to bathroom.  Will d/c back to NH with Ortho MD f/u.   Laray Anger, DO 07/02/11 [phone]

## 2011-07-05 ENCOUNTER — Emergency Department (HOSPITAL_COMMUNITY)
Admission: EM | Admit: 2011-07-05 | Discharge: 2011-07-05 | Disposition: A | Discharge status: Home / Self Care | Payer: Medicare Other | Attending: Emergency Medicine | Admitting: Emergency Medicine

## 2011-07-05 ENCOUNTER — Emergency Department (HOSPITAL_COMMUNITY): Payer: Medicare Other

## 2011-07-05 ENCOUNTER — Encounter (HOSPITAL_COMMUNITY): Payer: Self-pay | Admitting: Emergency Medicine

## 2011-07-05 DIAGNOSIS — E785 Hyperlipidemia, unspecified: Secondary | ICD-10-CM | POA: Insufficient documentation

## 2011-07-05 DIAGNOSIS — S40019A Contusion of unspecified shoulder, initial encounter: Secondary | ICD-10-CM | POA: Insufficient documentation

## 2011-07-05 DIAGNOSIS — M25519 Pain in unspecified shoulder: Secondary | ICD-10-CM | POA: Insufficient documentation

## 2011-07-05 DIAGNOSIS — E119 Type 2 diabetes mellitus without complications: Secondary | ICD-10-CM | POA: Insufficient documentation

## 2011-07-05 DIAGNOSIS — I10 Essential (primary) hypertension: Secondary | ICD-10-CM | POA: Insufficient documentation

## 2011-07-05 DIAGNOSIS — W19XXXA Unspecified fall, initial encounter: Secondary | ICD-10-CM | POA: Insufficient documentation

## 2011-07-05 DIAGNOSIS — S42213A Unspecified displaced fracture of surgical neck of unspecified humerus, initial encounter for closed fracture: Secondary | ICD-10-CM

## 2011-07-05 DIAGNOSIS — Z79899 Other long term (current) drug therapy: Secondary | ICD-10-CM | POA: Insufficient documentation

## 2011-07-05 DIAGNOSIS — M810 Age-related osteoporosis without current pathological fracture: Secondary | ICD-10-CM | POA: Insufficient documentation

## 2011-07-05 DIAGNOSIS — Z794 Long term (current) use of insulin: Secondary | ICD-10-CM | POA: Insufficient documentation

## 2011-07-05 NOTE — ED Provider Notes (Signed)
History   This chart was scribed for Toy Baker, MD by Charolett Bumpers . The patient was seen in room APA05/APA05 and the patient's care was started at 8:50am.    CSN: [id number]  Arrival date & time 07/05/11  4098   First MD Initiated Contact with Patient 07/05/11 [phone]      Chief Complaint  Patient presents with  . Fall    (Consider location/radiation/quality/duration/timing/severity/associated sxs/prior treatment) HPI Michelle Sweeney is a 77 y.o. female who presents to the Emergency Department accompanied by family member complaining of constant, moderate to severe left shoulder pain associated with a previous fall that occurred 3 days ago. The patient was previously seen in the ED for this injury with x-rays obtained to left elbow/forearm. The patient was given percocet prior to ED arrival. Family member states that patient was ambulatory prior to fall, but is no longer ambulatory. Denies new falls/new trauma. Pain worse with movement and better with nothing  Past Medical History  Diagnosis Date  . IBS (irritable bowel syndrome)   . HTN (hypertension)   . Osteoporosis   . Hyperlipidemia   . Hyperkalemia   . Diabetes mellitus     Past Surgical History  Procedure Date  . Vascular surgery     History reviewed. No pertinent family history.  History  Substance Use Topics  . Smoking status: Never Smoker   . Smokeless tobacco: Not on file  . Alcohol Use: No    OB History    Grav Para Term Preterm Abortions TAB SAB Ect Mult Living                  Review of Systems A complete 10 system review of systems was obtained and is otherwise negative except as noted in the HPI and PMH.   Allergies  Penicillins and Quinine derivatives  Home Medications   Current Outpatient Rx  Name Route Sig Dispense Refill  . ACETAMINOPHEN 500 MG PO TABS Oral Take 1,000 mg by mouth every 6 (six) hours as needed. pain     . ALBUTEROL SULFATE HFA 108 (90 BASE) MCG/ACT IN AERS  Inhalation Inhale 2 puffs into the lungs every 6 (six) hours as needed. wheezing     . ALPRAZOLAM 1 MG PO TABS Oral Take 1 mg by mouth 3 (three) times daily.      Marland Kitchen AMITRIPTYLINE HCL 10 MG PO TABS Oral Take 10 mg by mouth at bedtime.      . ASPIRIN EC 81 MG PO TBEC Oral Take 81 mg by mouth daily.      Marland Kitchen BIMATOPROST 0.03 % OP SOLN Both Eyes Place 1 drop into both eyes at bedtime.      Marland Kitchen CALCIUM CARBONATE-VITAMIN D 250-125 MG-UNIT PO TABS Oral Take 1 tablet by mouth 2 (two) times daily.      Marland Kitchen VITAMIN D 1000 UNITS PO TABS Oral Take 1,000 Units by mouth daily.      Marland Kitchen CITALOPRAM HYDROBROMIDE 20 MG PO TABS Oral Take 20 mg by mouth daily.      Marland Kitchen GLUCOSE 40 % PO GEL Oral Take 1 Tube by mouth once as needed. When blood sugar is 60     . FUROSEMIDE 20 MG PO TABS Oral Take 20 mg by mouth daily.      . GUAIFENESIN 400 MG PO TABS Oral Take 400 mg by mouth every 4 (four) hours. cough     . INSULIN GLARGINE 100 UNIT/ML [place] SOLN Subcutaneous Inject 10  Units into the skin at bedtime.      Marland Kitchen FLORAJEN ACIDOPHILUS PO CAPS Oral Take 1 capsule by mouth daily.      Marland Kitchen LISINOPRIL 5 MG PO TABS Oral Take 5 mg by mouth daily.      Marland Kitchen LOPERAMIDE HCL 2 MG PO CAPS Oral Take 4 mg by mouth 4 (four) times daily as needed. Loose stool     . LORATADINE 10 MG PO TABS Oral Take 10 mg by mouth daily as needed. allergies     . LOVASTATIN 20 MG PO TABS Oral Take 20 mg by mouth at bedtime.      . OXYCODONE-ACETAMINOPHEN 5-325 MG PO TABS Oral Take 1 tablet by mouth every 4 (four) hours as needed for pain. 20 tablet 0  . POLYETHYL GLYCOL-PROPYL GLYCOL 0.4-0.3 % OP SOLN Right Eye Place 1 drop into the right eye 3 (three) times daily.      Marland Kitchen POTASSIUM CHLORIDE 20 MEQ PO PACK Oral Take 20 mEq by mouth daily.      Marland Kitchen PROMETHAZINE HCL 25 MG PO TABS Oral Take 25 mg by mouth every 6 (six) hours as needed. nausea     . RIVASTIGMINE 9.5 MG/24HR TD PT24 Transdermal Place 1 patch onto the skin daily.      Marland Kitchen SIMVASTATIN 80 MG PO TABS Oral Take 80 mg  by mouth 3 (three) times daily as needed. indigestion       BP 112/57  Pulse 55  Temp(Src) 97.9 F (36.6 C) (Oral)  Resp 24  Wt 140 lb (63.504 kg)  SpO2 100%  Physical Exam  Nursing note and vitals reviewed. Constitutional: She is oriented to person, place, and time. She appears well-developed and well-nourished. No distress.  HENT:  Head: Normocephalic and atraumatic.  Eyes: EOM are normal. Pupils are equal, round, and reactive to light.  Neck: Neck supple. No tracheal deviation present.  Cardiovascular: Normal rate.   Pulmonary/Chest: Effort normal. No respiratory distress.  Abdominal: Soft. She exhibits no distension.  Musculoskeletal: Normal range of motion. She exhibits no edema.       Area of ecchymosis to Left shoulder. Tenderness to palpation to anterior left shoulder. Left hand color normal.  Neurological: She is alert and oriented to person, place, and time. No sensory deficit.  Skin: Skin is warm and dry.  Psychiatric: She has a normal mood and affect. Her behavior is normal.    ED Course  Procedures (including critical care time) DIAGNOSTIC STUDIES: Oxygen Saturation is 100% on room air, normal by my interpretation.    COORDINATION OF CARE:     Labs Reviewed - No data to display Dg Shoulder Left  07/05/2011  *RADIOLOGY REPORT*  Clinical Data: Fall, left shoulder pain.  LEFT SHOULDER - 2+ VIEW  Comparison: 07/02/2011  Findings: There is a left humeral neck fracture.  Small lateral fragment is slightly displaced.  No subluxation or dislocation. Advanced degenerative changes in the left glenohumeral and AC joints.  IMPRESSION: Left humeral neck fracture.  Original Report Authenticated By: Cyndie Chime, M.D.     No diagnosis found.    MDM  Patient's x-ray of her shoulder noted and does show a lateral humeral neck fracture. Patient will continue to use her sling for comfort and follow with her orthopedic surgeon  I personally performed the services  described in this documentation, which was scribed in my presence. The recorded information has been reviewed and considered.       Toy Baker, MD 07/05/11  0953 

## 2011-07-05 NOTE — ED Notes (Addendum)
Pt fell on Thursday and seen in ed. Pt sent to ed for re-evaluation left shoulder and pain. No new injury. Pt given percocet 5/325mg  10 minutes pta. nad noted.

## 2011-07-06 ENCOUNTER — Encounter (HOSPITAL_COMMUNITY): Payer: Self-pay | Admitting: *Deleted

## 2011-07-06 ENCOUNTER — Emergency Department (HOSPITAL_COMMUNITY)
Admission: EM | Admit: 2011-07-06 | Discharge: 2011-07-06 | Disposition: A | Discharge status: Other Acute Inpatient Hospital | Payer: Medicare Other | Attending: Emergency Medicine | Admitting: Emergency Medicine

## 2011-07-06 ENCOUNTER — Emergency Department (HOSPITAL_COMMUNITY): Payer: Medicare Other

## 2011-07-06 ENCOUNTER — Inpatient Hospital Stay (HOSPITAL_COMMUNITY)
Admission: EM | Admit: 2011-07-06 | Discharge: 2011-07-09 | DRG: 897 | Disposition: A | Discharge status: Skilled Nursing Facility | Payer: Medicare Other | Attending: Internal Medicine | Admitting: Internal Medicine

## 2011-07-06 ENCOUNTER — Encounter (HOSPITAL_COMMUNITY): Payer: Self-pay

## 2011-07-06 DIAGNOSIS — S42409A Unspecified fracture of lower end of unspecified humerus, initial encounter for closed fracture: Secondary | ICD-10-CM | POA: Insufficient documentation

## 2011-07-06 DIAGNOSIS — F0280 Dementia in other diseases classified elsewhere without behavioral disturbance: Secondary | ICD-10-CM | POA: Diagnosis present

## 2011-07-06 DIAGNOSIS — E785 Hyperlipidemia, unspecified: Secondary | ICD-10-CM | POA: Insufficient documentation

## 2011-07-06 DIAGNOSIS — M810 Age-related osteoporosis without current pathological fracture: Secondary | ICD-10-CM | POA: Insufficient documentation

## 2011-07-06 DIAGNOSIS — M25519 Pain in unspecified shoulder: Secondary | ICD-10-CM | POA: Insufficient documentation

## 2011-07-06 DIAGNOSIS — G309 Alzheimer's disease, unspecified: Secondary | ICD-10-CM | POA: Diagnosis present

## 2011-07-06 DIAGNOSIS — W19XXXA Unspecified fall, initial encounter: Secondary | ICD-10-CM | POA: Diagnosis present

## 2011-07-06 DIAGNOSIS — F411 Generalized anxiety disorder: Secondary | ICD-10-CM | POA: Diagnosis present

## 2011-07-06 DIAGNOSIS — S42309A Unspecified fracture of shaft of humerus, unspecified arm, initial encounter for closed fracture: Secondary | ICD-10-CM | POA: Diagnosis present

## 2011-07-06 DIAGNOSIS — H409 Unspecified glaucoma: Secondary | ICD-10-CM | POA: Diagnosis present

## 2011-07-06 DIAGNOSIS — S42402A Unspecified fracture of lower end of left humerus, initial encounter for closed fracture: Secondary | ICD-10-CM

## 2011-07-06 DIAGNOSIS — R296 Repeated falls: Secondary | ICD-10-CM

## 2011-07-06 DIAGNOSIS — R102 Pelvic and perineal pain: Secondary | ICD-10-CM

## 2011-07-06 DIAGNOSIS — E119 Type 2 diabetes mellitus without complications: Secondary | ICD-10-CM | POA: Insufficient documentation

## 2011-07-06 DIAGNOSIS — R5381 Other malaise: Secondary | ICD-10-CM

## 2011-07-06 DIAGNOSIS — Y9210 Unspecified residential institution as the place of occurrence of the external cause: Secondary | ICD-10-CM | POA: Insufficient documentation

## 2011-07-06 DIAGNOSIS — I10 Essential (primary) hypertension: Secondary | ICD-10-CM | POA: Insufficient documentation

## 2011-07-06 DIAGNOSIS — F0390 Unspecified dementia without behavioral disturbance: Secondary | ICD-10-CM

## 2011-07-06 DIAGNOSIS — E860 Dehydration: Secondary | ICD-10-CM | POA: Diagnosis present

## 2011-07-06 DIAGNOSIS — F419 Anxiety disorder, unspecified: Secondary | ICD-10-CM

## 2011-07-06 DIAGNOSIS — F19921 Other psychoactive substance use, unspecified with intoxication with delirium: Principal | ICD-10-CM | POA: Diagnosis present

## 2011-07-06 DIAGNOSIS — S42213A Unspecified displaced fracture of surgical neck of unspecified humerus, initial encounter for closed fracture: Secondary | ICD-10-CM | POA: Diagnosis present

## 2011-07-06 DIAGNOSIS — G8911 Acute pain due to trauma: Secondary | ICD-10-CM | POA: Diagnosis present

## 2011-07-06 DIAGNOSIS — Z79899 Other long term (current) drug therapy: Secondary | ICD-10-CM | POA: Insufficient documentation

## 2011-07-06 DIAGNOSIS — K589 Irritable bowel syndrome without diarrhea: Secondary | ICD-10-CM | POA: Insufficient documentation

## 2011-07-06 DIAGNOSIS — S42293A Other displaced fracture of upper end of unspecified humerus, initial encounter for closed fracture: Secondary | ICD-10-CM

## 2011-07-06 DIAGNOSIS — T40605A Adverse effect of unspecified narcotics, initial encounter: Secondary | ICD-10-CM | POA: Diagnosis present

## 2011-07-06 DIAGNOSIS — S42209A Unspecified fracture of upper end of unspecified humerus, initial encounter for closed fracture: Secondary | ICD-10-CM

## 2011-07-06 HISTORY — DX: Dementia in other diseases classified elsewhere, unspecified severity, without behavioral disturbance, psychotic disturbance, mood disturbance, and anxiety: F02.80

## 2011-07-06 HISTORY — DX: Alzheimer's disease, unspecified: G30.9

## 2011-07-06 HISTORY — DX: Unspecified osteoarthritis, unspecified site: M19.90

## 2011-07-06 LAB — BASIC METABOLIC PANEL
BUN: 19 mg/dL (ref 6–23)
Chloride: 106 mEq/L (ref 96–112)
GFR calc Af Amer: >90 mL/min (ref >90)
GFR calc non Af Amer: 89 mL/min — ABNORMAL LOW (ref >90)
Potassium: 3.6 mEq/L (ref 3.5–5.1)
Sodium: 138 mEq/L (ref 135–145)

## 2011-07-06 LAB — GLUCOSE, CAPILLARY: Glucose-Capillary: 100 mg/dL — ABNORMAL HIGH (ref 70–99)

## 2011-07-06 LAB — URINALYSIS, ROUTINE W REFLEX MICROSCOPIC
Leukocytes, UA: NEGATIVE
Nitrite: NEGATIVE
Specific Gravity, Urine: >1.03 — ABNORMAL HIGH (ref 1.005–1.030)
Urobilinogen, UA: 0.2 mg/dL (ref 0.0–1.0)
pH: 5.5 (ref 5.0–8.0)

## 2011-07-06 MED ORDER — SODIUM CHLORIDE 0.9 % IV BOLUS (SEPSIS)
1000.0000 mL | Freq: Once | INTRAVENOUS | Status: AC
  Administered 2011-07-06: 1000 mL via INTRAVENOUS

## 2011-07-06 MED ORDER — ONDANSETRON HCL 4 MG/2ML IJ SOLN
4.0000 mg | Freq: Once | INTRAMUSCULAR | Status: AC
  Administered 2011-07-06: 4 mg via INTRAVENOUS
  Filled 2011-07-06: qty 2

## 2011-07-06 MED ORDER — MORPHINE SULFATE 2 MG/ML IJ SOLN
2.0000 mg | Freq: Once | INTRAMUSCULAR | Status: DC
  Filled 2011-07-06: qty 1

## 2011-07-06 MED ORDER — IBUPROFEN 400 MG PO TABS: 400.0000 mg | ORAL_TABLET | Freq: Three times a day (TID) | ORAL | Status: AC | PRN

## 2011-07-06 MED ORDER — SODIUM CHLORIDE 0.9 % IV SOLN: INTRAVENOUS | Status: DC

## 2011-07-06 MED ORDER — MORPHINE SULFATE 2 MG/ML IJ SOLN
2.0000 mg | Freq: Once | INTRAMUSCULAR | Status: AC
  Administered 2011-07-06: 2 mg via INTRAVENOUS

## 2011-07-06 MED ORDER — OXYCODONE-ACETAMINOPHEN 5-325 MG PO TABS
2.0000 | ORAL_TABLET | Freq: Once | ORAL | Status: AC
  Administered 2011-07-06: 2 via ORAL
  Filled 2011-07-06: qty 2

## 2011-07-06 MED ORDER — OXYCODONE-ACETAMINOPHEN 5-325 MG PO TABS: 2.0000 | ORAL_TABLET | ORAL | Status: DC | PRN

## 2011-07-06 MED ORDER — HYDROMORPHONE HCL PF 1 MG/ML IJ SOLN
1.0000 mg | Freq: Once | INTRAMUSCULAR | Status: AC
  Administered 2011-07-06: 1 mg via INTRAMUSCULAR
  Filled 2011-07-06: qty 1

## 2011-07-06 MED ORDER — KETOROLAC TROMETHAMINE 30 MG/ML IJ SOLN
30.0000 mg | Freq: Once | INTRAMUSCULAR | Status: AC
  Administered 2011-07-06: 30 mg via INTRAMUSCULAR
  Filled 2011-07-06: qty 1

## 2011-07-06 MED ORDER — MORPHINE SULFATE 4 MG/ML IJ SOLN
4.0000 mg | Freq: Once | INTRAMUSCULAR | Status: DC
  Administered 2011-07-06: 2 mg via INTRAVENOUS
  Filled 2011-07-06: qty 1

## 2011-07-06 NOTE — ED Notes (Signed)
Daughter at bedside, stated her mother fell 3 days ago and has a fractured left arm. Left arm is in a sling at this time. Daughter states the pain medication that was originally prescribed is not helping patient. Patient lying in bed crying and moaning, stating "my arm hurts."

## 2011-07-06 NOTE — H&P (Signed)
Michelle Sweeney is an 77 y.o. female.    PCP: Donzetta Sprung, MD in [place], [place].  Ortho: Dr. Randa Evens in [place].  Chief Complaint: Pain  HPI: This is a 77 year old, Caucasian female, with a past medical history of Alzheimer's disease, glaucoma, diabetes, who was in a usual state of health couple weeks ago, when she went to her doctor's office with anxiety and some panic issues. The dose of her Xanax was increased from 0.25 mg 3 times a day to 0.5 mg 3 times a day. Subsequently, was increased to 1 mg 3 times a day. Patient lives in assisted living facility and she fell in that facility on Tuesday after these dose changes. She was taken to the ED, underwent x-rays, which did not show any obvious fractures. She fell again on Thursday, and this time was found to have elbow fracture, and an old humeral fracture was noted. Patient was seen by orthopedic doctor in [facility] Dr. Randa Evens, who put put her on a sling. She had more pain came back to the ED, a couple of times since then. She was given pain medications in the form of 2 Percocet every 4 hours. This morning she was lying in the pain complaining of pain in the groin area. She was brought back in to the ED, underwent more x-rays and then underwent CT scans, which have not revealed any new fractures. Patient, according to the son and the daughter, has been very deconditioned. Has not been getting up and doing anything at the nursing facility. Hasn't eaten or had anything to drink properly in the last few days. The pain is been getting worse and they're very concerned about her. Patient does have, Alzheimer's dementia, which is mild according to the family, but the patient is unable to give me any history at this time..   Prior to Admission medications   Medication Sig Start Date End Date Taking? Authorizing Provider  acetaminophen (TYLENOL) 500 MG tablet Take 1,000 mg by mouth every 6 (six) hours as needed. pain    Yes Historical Provider, MD  ALPRAZolam Prudy Feeler) 1 MG  tablet Take 1 mg by mouth 3 (three) times daily as needed. For anxiety   Yes Historical Provider, MD  amitriptyline (ELAVIL) 10 MG tablet Take 10 mg by mouth at bedtime. For IBS   Yes Historical Provider, MD  aspirin EC 81 MG tablet Take 81 mg by mouth every morning.    Yes Historical Provider, MD  bimatoprost (LUMIGAN) 0.03 % ophthalmic solution Place 1 drop into both eyes at bedtime.     Yes Historical Provider, MD  Calcium Carbonate-Vitamin D (CALCIUM 600 + D PO) Take 1 tablet by mouth 2 (two) times daily.     Yes Historical Provider, MD  cholecalciferol (VITAMIN D) 1000 UNITS tablet Take 1,000 Units by mouth daily.     Yes Historical Provider, MD  citalopram (CELEXA) 20 MG tablet Take 20 mg by mouth daily.     Yes Historical Provider, MD  furosemide (LASIX) 20 MG tablet Take 20 mg by mouth every morning.    Yes Historical Provider, MD  ibuprofen (ADVIL,MOTRIN) 400 MG tablet Take 1 tablet (400 mg total) by mouth every 8 (eight) hours as needed for pain. 07/06/11 07/16/11 Yes Felisa Bonier, MD  insulin glargine (LANTUS) 100 UNIT/ML injection Inject 10 Units into the skin at bedtime.     Yes Historical Provider, MD  Lactobacillus (FLORAJEN ACIDOPHILUS) CAPS Take 1 capsule by mouth every morning.    Yes Historical  Provider, MD  loperamide (IMODIUM) 2 MG capsule Take 4 mg by mouth 3 (three) times daily. Loose stool   Yes Historical Provider, MD  lovastatin (MEVACOR) 20 MG tablet Take 20 mg by mouth at bedtime.     Yes Historical Provider, MD  oxyCODONE-acetaminophen (PERCOCET) 5-325 MG per tablet Take 2 tablets by mouth every 4 (four) hours as needed.   07/06/11 07/16/11 Yes Felisa Bonier, MD  Polyethyl Glycol-Propyl Glycol (SYSTANE) 0.4-0.3 % SOLN Place 1 drop into the right eye 3 (three) times daily. UNTIL GONE   Yes Historical Provider, MD  potassium chloride (KLOR-CON) 20 MEQ packet Take 20 mEq by mouth every morning.    Yes Historical Provider, MD  promethazine (PHENERGAN) 25 MG tablet Take 25  mg by mouth every 6 (six) hours as needed. nausea    Yes Historical Provider, MD  rivastigmine (EXELON) 9.5 mg/24hr Place 1 patch onto the skin daily. Remove used patches before applying a new patch   Yes Historical Provider, MD  albuterol (PROVENTIL HFA;VENTOLIN HFA) 108 (90 BASE) MCG/ACT inhaler Inhale 2 puffs into the lungs every 6 (six) hours as needed. wheezing     Historical Provider, MD    Allergies:  Allergies  Allergen Reactions  . Penicillins   . Quinine Derivatives     Past Medical History  Diagnosis Date  . IBS (irritable bowel syndrome)   . HTN (hypertension)   . Osteoporosis   . Hyperlipidemia   . Hyperkalemia   . Diabetes mellitus   . Alzheimer disease     Past Surgical History  Procedure Date  . Vascular surgery   . Abdominal hysterectomy     Social History:  reports that she has never smoked. She does not have any smokeless tobacco history on file. She reports that she does not drink alcohol or use illicit drugs.  Family History: Her mother lived to 64. No medical problems per family.  Review of Systems - History obtained from unobtainable from patient due to mental status Unable to do ROS due to same reason.  Physical Examination Blood pressure 101/49, pulse 59, SpO2 94.00%.  General appearance: alert, distracted, no distress and uncooperative Head: Normocephalic, without obvious abnormality, atraumatic Eyes: conjunctivae/corneas clear. PERRL, EOM's intact. Fundi benign. Throat: lips, mucosa, and tongue normal; teeth and gums normal Neck: no adenopathy, no carotid bruit, no JVD, supple, symmetrical, trachea midline and thyroid not enlarged, symmetric, no tenderness/mass/nodules Resp: clear to auscultation bilaterally Cardio: regular rate and rhythm, S1, S2 normal, no murmur, click, rub or gallop GI: soft, non-tender; bowel sounds normal; no masses,  no organomegaly Extremities: no obvious deformity noted. Movement of lower extremity causes pain but she  cries even just with touch. No obvious joint abnormality except for left upper extremity. Pulses: 2+ and symmetric Skin: Skin color, texture, turgor normal. No rashes or lesions Lymph nodes: Cervical, supraclavicular, and axillary nodes normal. Neurologic: Mental status: Alert, confused. Not answering questions. Not fully following commands. Moving all extremities. Confused.  Results for orders placed during the hospital encounter of 07/06/11 (from the past 48 hour(s))  BASIC METABOLIC PANEL     Status: Abnormal   Collection Time   07/06/11  7:21 PM      Component Value Range Comment   Sodium 138  135 - 145 (mEq/L)    Potassium 3.6  3.5 - 5.1 (mEq/L)    Chloride 106  96 - 112 (mEq/L)    CO2 26  19 - 32 (mEq/L)    Glucose, Bld  119 (*) 70 - 99 (mg/dL)    BUN 19  6 - 23 (mg/dL)    Creatinine, Ser 7.82  0.50 - 1.10 (mg/dL)    Calcium 9.1  8.4 - 10.5 (mg/dL)    GFR calc non Af Amer 89 (*) >90 (mL/min)    GFR calc Af Amer >90  >90 (mL/min)    Dg Pelvis 1-2 Views  07/06/2011  *RADIOLOGY REPORT*  Clinical Data: Fall.  Pelvic pain.  PELVIS - 1-2 VIEW  Comparison: None.  Findings: Pelvic rings grossly appear intact.  Hips appear located. The hips are externally rotated at the time of imaging.  No femoral neck fracture is identified.  Faint lucency is present in the right greater trochanter which could represent a small trochanteric avulsion fracture.  CT may be useful for further evaluation if the patient has clinical symptoms in this region.  IMPRESSION: No displaced femoral neck fracture identified.  Possible avulsion of the right greater trochanter.  Original Report Authenticated By: Andreas Newport, M.D.   Ct Pelvis Wo Contrast  07/06/2011  *RADIOLOGY REPORT*  Clinical Data:  Groin pain.  Fall.  Pelvic fracture.  CT PELVIS WITHOUT CONTRAST  Technique:  Multidetector CT imaging of the pelvis was performed following the standard protocol without intravenous contrast.  Comparison:  Plain films  07/06/2011.  Findings:  The obturator rings are intact.  Calcific tendonitis of both gluteal insertions at the greater trochanter.  Common hamstring origin calcific tendonitis is also present.  Bilateral SI joint degenerative disease.  Sacrum appears intact.  The lower lumbar spondylosis and facet arthrosis.  No acute abnormality in the visceral pelvis.  Hysterectomy.  Atherosclerosis.  There is no hip fracture.  Mild bilateral hip osteoarthritis. Abnormality on prior radiograph represents calcific tendonitis, which is greater on the left than right.  There is a small distal tendinous bone fragment that accounted for the abnormality on radiograph.  Muscular compartments grossly appear within normal limits.  Sagittal images demonstrate degenerative grade 1 anterolisthesis of L4 on L5.  Broad-based calcified L3-L4 disc protrusion.  IMPRESSION: No acute osseous abnormality.  Calcific  tendonitis accounts for fragment seen on prior radiograph.  No pelvic or hip fracture.  Original Report Authenticated By: Andreas Newport, M.D.   Dg Shoulder Left  07/05/2011  *RADIOLOGY REPORT*  Clinical Data: Fall, left shoulder pain.  LEFT SHOULDER - 2+ VIEW  Comparison: 07/02/2011  Findings: There is a left humeral neck fracture.  Small lateral fragment is slightly displaced.  No subluxation or dislocation. Advanced degenerative changes in the left glenohumeral and AC joints.  IMPRESSION: Left humeral neck fracture.  Original Report Authenticated By: Cyndie Chime, M.D.     Assessment/Plan  Principal Problem:  *Acute pain Active Problems:  Fall  Physical deconditioning  Alzheimer disease  Anxiety disorder  Glaucoma  DM type 2 (diabetes mellitus, type 2)   #1 acute pain, secondary to the left upper extremity fractures. She is also confused. She is deconditioned. She is clearly unable to stay at her assisted living facility. I believe the precipitant cause for her falls may have been the escalating doses of Xanax.  And, currently, now her pain needs to be better controlled. We will need to have her seen by physical therapy and occupational therapy. It is quite possible that the patient may require skilled nursing facility placement for short-term rehabilitation. We will put her on around-the-clock dosage of oxycodone. Cut back on her dose of Xanax. Try to control her agitation by pain control measures  and  with Haldol if needed. EKG will be obtained to check QT interval. Will also get a UA to make, sure she doesn't have a urinary tract infection.  #2 history of Alzheimer's disease: This is mild according to the family. Continue with Exelon patch.  #3 left humeral neck fracture and a left elbow fracture: She has been seen by Ortho at [facility] and she will need to followup with the same physician when she is discharged from the hospital here.  #4 history of anxiety disorder: Continue with Celexa. Please see above. We will decrease the dose of her Xanax.  #5 history of glaucoma: Continue with eye drops.  #6 diabetes, type II continue with Lantus. Her on a sliding scale. Check HbA1c.  CODE STATUS was discussed with the family and the son and the daughter would like to discuss this among themselves before getting back to [place]. At this time the patient remains full code.  DVT prophylaxis will be initiated.  Further management decisions will depend on results of further testing and patient's response to treatment  [employer] Pager [phone]  07/06/2011, 10:37 PM

## 2011-07-06 NOTE — ED Notes (Signed)
Patient lying in bed sleeping. O2 sats 90-93%. Placed patient on 2L nasal canula. O2 saturation increased to 96%. No complaints of pain at this time. Family at bedside.

## 2011-07-06 NOTE — ED Notes (Signed)
Pt presently resting quietly with eyes closed.  Respirations regular and unlabored.  Does respond to painful stimuli. Foley to gravity, draining with no difficulty.  O2 saturation 90-92% on room air.  Placed on 2 LPM via nasal canula.  Saturations increased to 96%.

## 2011-07-06 NOTE — ED Provider Notes (Signed)
History     CSN: [id number]  Arrival date & time 07/06/11  4098   First MD Initiated Contact with Patient 07/06/11 [phone]      Chief Complaint  Patient presents with  . Shoulder Pain    (Consider location/radiation/quality/duration/timing/severity/associated sxs/prior treatment) HPI Comments: On arrival to the ED in complaint of shoulder pain, the patient received 1 mg of Dilaudid intramuscular for relief of pain after which she became overly sedated and lethargic, impairing the ability to take a history or obtain a review of systems.  Patient is a 77 y.o. female presenting with shoulder pain. The history is provided by the patient, medical records and a relative.  Shoulder Pain This is a recurrent problem. Episode onset: the patient had a fall approximately 5 days ago after which she was seen in the emergency department and diagnosed with a fracture of the left elbow and left proximal humeral neck. She has a sling in place on the left upper extremity for immobilization. The problem occurs constantly (the patient presents to the emergency department for persistent pain at the left shoulder that has not been improved with Percocet at home. She has been taking 1 tab of Percocet at a time.). The problem has not changed (her daughter who accompanies her reports that the patient lives at an assisted living facility ([facility]) and expresses concern over the patient's ability to care for herself at home.) since onset.Pertinent negatives include no chest pain, no abdominal pain, no headaches and no shortness of breath. Exacerbated by: movement of the left upper extremity at the shoulder or elbow, palpation of the left shoulder. The symptoms are relieved by nothing (no relief from single tablet Percocet treatment at home).    Past Medical History  Diagnosis Date  . IBS (irritable bowel syndrome)   . HTN (hypertension)   . Osteoporosis   . Hyperlipidemia   . Hyperkalemia   . Diabetes mellitus      Past Surgical History  Procedure Date  . Vascular surgery     No family history on file.  History  Substance Use Topics  . Smoking status: Never Smoker   . Smokeless tobacco: Not on file  . Alcohol Use: No    OB History    Grav Para Term Preterm Abortions TAB SAB Ect Mult Living                  Review of Systems  Unable to perform ROS: Mental status change  Respiratory: Negative for shortness of breath.   Cardiovascular: Negative for chest pain.  Gastrointestinal: Negative for abdominal pain.  Neurological: Negative for headaches.    Allergies  Penicillins and Quinine derivatives  Home Medications   Current Outpatient Rx  Name Route Sig Dispense Refill  . ACETAMINOPHEN 500 MG PO TABS Oral Take 1,000 mg by mouth every 6 (six) hours as needed. pain     . ALBUTEROL SULFATE HFA 108 (90 BASE) MCG/ACT IN AERS Inhalation Inhale 2 puffs into the lungs every 6 (six) hours as needed. wheezing     . ALPRAZOLAM 1 MG PO TABS Oral Take 1 mg by mouth 3 (three) times daily.      Marland Kitchen AMITRIPTYLINE HCL 10 MG PO TABS Oral Take 10 mg by mouth at bedtime.      . ASPIRIN EC 81 MG PO TBEC Oral Take 81 mg by mouth daily.      Marland Kitchen BIMATOPROST 0.03 % OP SOLN Both Eyes Place 1 drop into both eyes at  bedtime.      Marland Kitchen CALCIUM CARBONATE-VITAMIN D 250-125 MG-UNIT PO TABS Oral Take 1 tablet by mouth 2 (two) times daily.      Marland Kitchen VITAMIN D 1000 UNITS PO TABS Oral Take 1,000 Units by mouth daily.      Marland Kitchen CITALOPRAM HYDROBROMIDE 20 MG PO TABS Oral Take 20 mg by mouth daily.      Marland Kitchen GLUCOSE 40 % PO GEL Oral Take 1 Tube by mouth once as needed. When blood sugar is 60     . FUROSEMIDE 20 MG PO TABS Oral Take 20 mg by mouth daily.      . GUAIFENESIN 400 MG PO TABS Oral Take 400 mg by mouth every 4 (four) hours. cough     . INSULIN GLARGINE 100 UNIT/ML [place] SOLN Subcutaneous Inject 10 Units into the skin at bedtime.      Marland Kitchen FLORAJEN ACIDOPHILUS PO CAPS Oral Take 1 capsule by mouth daily.      Marland Kitchen LISINOPRIL 5 MG  PO TABS Oral Take 5 mg by mouth daily.      Marland Kitchen LOPERAMIDE HCL 2 MG PO CAPS Oral Take 4 mg by mouth 4 (four) times daily as needed. Loose stool     . LORATADINE 10 MG PO TABS Oral Take 10 mg by mouth daily as needed. allergies     . LOVASTATIN 20 MG PO TABS Oral Take 20 mg by mouth at bedtime.      . OXYCODONE-ACETAMINOPHEN 5-325 MG PO TABS Oral Take 1 tablet by mouth every 4 (four) hours as needed for pain. 20 tablet 0  . POLYETHYL GLYCOL-PROPYL GLYCOL 0.4-0.3 % OP SOLN Right Eye Place 1 drop into the right eye 3 (three) times daily.      Marland Kitchen POTASSIUM CHLORIDE 20 MEQ PO PACK Oral Take 20 mEq by mouth daily.      Marland Kitchen PROMETHAZINE HCL 25 MG PO TABS Oral Take 25 mg by mouth every 6 (six) hours as needed. nausea     . RIVASTIGMINE 9.5 MG/24HR TD PT24 Transdermal Place 1 patch onto the skin daily.      Marland Kitchen SIMVASTATIN 80 MG PO TABS Oral Take 80 mg by mouth 3 (three) times daily as needed. indigestion       BP 97/44  Pulse 54  Temp(Src) 97.8 F (36.6 C) (Oral)  Resp 18  Ht 5\' 4"  (1.626 m)  Wt 150 lb (68.04 kg)  BMI 25.75 kg/m2  SpO2 100%  Physical Exam  Nursing note and vitals reviewed. Constitutional: She is oriented to person, place, and time. She appears well-developed and well-nourished. No distress.  HENT:  Head: Normocephalic and atraumatic.  Mouth/Throat: Oropharynx is clear and moist.  Eyes: Conjunctivae and EOM are normal.  Neck: Normal range of motion.  Cardiovascular: Normal rate, regular rhythm, normal heart sounds and intact distal pulses.  Exam reveals no gallop and no friction rub.   No murmur heard. Pulmonary/Chest: Effort normal and breath sounds normal. No respiratory distress. She has no wheezes. She has no rales. She exhibits no tenderness.  Abdominal: Soft. Bowel sounds are normal. She exhibits no distension. There is no tenderness. There is no rebound and no guarding.  Musculoskeletal: She exhibits tenderness. She exhibits no edema.       Left shoulder: She exhibits  decreased range of motion, tenderness, bony tenderness and pain. She exhibits no swelling, no effusion, no crepitus, no deformity and normal pulse.       Left elbow: She exhibits decreased range of  motion and swelling. She exhibits no effusion and no deformity. tenderness found.       Arms: Neurological: She is alert and oriented to person, place, and time. She has normal reflexes. No cranial nerve deficit. She exhibits normal muscle tone. Coordination normal.       At the time of examination or rather a reexamination, the patient has recovered from the somnolence induced by the narcotic analgesic and at this time is awake, alert, and oriented appropriately.  Skin: Skin is warm and dry. No rash noted. She is not diaphoretic. No erythema. No pallor.    ED Course  Procedures (including critical care time)  Labs Reviewed - No data to display Dg Shoulder Left  07/05/2011  *RADIOLOGY REPORT*  Clinical Data: Fall, left shoulder pain.  LEFT SHOULDER - 2+ VIEW  Comparison: 07/02/2011  Findings: There is a left humeral neck fracture.  Small lateral fragment is slightly displaced.  No subluxation or dislocation. Advanced degenerative changes in the left glenohumeral and AC joints.  IMPRESSION: Left humeral neck fracture.  Original Report Authenticated By: Cyndie Chime, M.D.     No diagnosis found.    MDM  The patient has a known proximal and distal left humeral fracture is immobilized with a sling currently and for which she is following up with orthopedic surgery. She is presented for in adequate pain control. I had discussed with the patient's daughter her concerns initially about the patient's ability to care for herself at home and offered to contact case management to attempt to arrange home health care for the patient. At this time the patient and her daughter state that they do not feel that necessary in that the patient will be fine caring for herself at home with her daughter's help over the  holidays. He requests discharge home. I find this to be a suitable plan and will discharge the patient home.        Felisa Bonier, MD 07/06/11 [phone]

## 2011-07-06 NOTE — ED Notes (Signed)
Per EMS - pt resident at [facility].  Pt fell x 2 last week.  C/o increased pain to pelvic area.

## 2011-07-06 NOTE — ED Provider Notes (Signed)
This chart was scribed for Ward Givens, MD by Williemae Natter. The patient was seen in room APA07/APA07 and the patient's care was started at 6:11.  CSN: [id number]  Arrival date & time 07/06/11  1747   First MD Initiated Contact with Patient 07/06/11 1804      Chief Complaint  Patient presents with  . Groin Pain    (Consider location/radiation/quality/duration/timing/severity/associated sxs/prior treatment) Patient is a 77 y.o. female presenting with groin pain. The history is provided by a relative. The history is limited by the condition of the patient.  Groin Pain  Pt seen at 6:11 PM Level 5 caveat due to dementia. Michelle Sweeney is a 77 y.o. female who presents to the Emergency Department complaining of pain in the pelvic area. Pt arrived from [facility] via ems. Pt's caregivers reported her having fallen twice in the past week and having fractures in her shoulder, arm and elbow. Pt has been eating and drinking all right. Pt has history of dementia and early Alzheimer's   Daughter reports patient fell about 5 AM about 6 days ago and hit her right face on her dresser. She was seen in the ER for that injury. She was seen again 4 days ago when she fell and she had her left arm caught behind herself and she was diagnosed with a fracture of her elbow. She was seen by Dr. Randa Evens orthopedics in [place] who put her in a cast and set her injury would not require surgery. She was seen again 2 days ago when she was complaining of severe pain in her left shoulder x-ray that point revealed a  humeral head fracture. She was seen in the ER earlier this morning with severe uncontrollable pain because the nursing home could not give her her pain medicine before it was scheduled to be given. She was given IM pain medicine and sent back. Daughter states she's noted that since she fell 4 days ago the patient has been non-ambulatory and she used to be ambulatory without assistance. Today she has been  complaining more of pain in her legs. She also is extremely painful may try to move her to change her clothes.   Dr Randa Evens in [place] on thurs on percocet and ibuprofen  Dr Re[facility]  Past Medical History  Diagnosis Date  . IBS (irritable bowel syndrome)   . HTN (hypertension)   . Osteoporosis   . Hyperlipidemia   . Hyperkalemia   . Diabetes mellitus     Past Surgical History  Procedure Date  . Vascular surgery     No family history on file.  History  Substance Use Topics  . Smoking status: Never Smoker   . Smokeless tobacco: Not on file  . Alcohol Use: No   patient lives in assisted living  OB History    Grav Para Term Preterm Abortions TAB SAB Ect Mult Living                  Review of Systems  Unable to perform ROS: Dementia    Allergies  Penicillins and Quinine derivatives  Home Medications   Current Outpatient Rx  Name Route Sig Dispense Refill  . ACETAMINOPHEN 500 MG PO TABS Oral Take 1,000 mg by mouth every 6 (six) hours as needed. pain     . ALPRAZOLAM 1 MG PO TABS Oral Take 1 mg by mouth 3 (three) times daily as needed. For anxiety    . AMITRIPTYLINE HCL 10  MG PO TABS Oral Take 10 mg by mouth at bedtime. For IBS    . ASPIRIN EC 81 MG PO TBEC Oral Take 81 mg by mouth every morning.     Marland Kitchen BIMATOPROST 0.03 % OP SOLN Both Eyes Place 1 drop into both eyes at bedtime.      Marland Kitchen CALCIUM 600 + D PO Oral Take 1 tablet by mouth 2 (two) times daily.      Marland Kitchen VITAMIN D 1000 UNITS PO TABS Oral Take 1,000 Units by mouth daily.      Marland Kitchen CITALOPRAM HYDROBROMIDE 20 MG PO TABS Oral Take 20 mg by mouth daily.      . FUROSEMIDE 20 MG PO TABS Oral Take 20 mg by mouth every morning.     . IBUPROFEN 400 MG PO TABS Oral Take 1 tablet (400 mg total) by mouth every 8 (eight) hours as needed for pain. 20 tablet 0  . INSULIN GLARGINE 100 UNIT/ML [place] SOLN Subcutaneous Inject 10 Units into the skin at bedtime.      Marland Kitchen FLORAJEN ACIDOPHILUS PO CAPS Oral Take 1 capsule  by mouth every morning.     Marland Kitchen LOPERAMIDE HCL 2 MG PO CAPS Oral Take 4 mg by mouth 3 (three) times daily. Loose stool    . LOVASTATIN 20 MG PO TABS Oral Take 20 mg by mouth at bedtime.      . OXYCODONE-ACETAMINOPHEN 5-325 MG PO TABS Oral Take 2 tablets by mouth every 4 (four) hours as needed.      Marland Kitchen POLYETHYL GLYCOL-PROPYL GLYCOL 0.4-0.3 % OP SOLN Right Eye Place 1 drop into the right eye 3 (three) times daily. UNTIL GONE    . POTASSIUM CHLORIDE 20 MEQ PO PACK Oral Take 20 mEq by mouth every morning.     Marland Kitchen PROMETHAZINE HCL 25 MG PO TABS Oral Take 25 mg by mouth every 6 (six) hours as needed. nausea     . RIVASTIGMINE 9.5 MG/24HR TD PT24 Transdermal Place 1 patch onto the skin daily. Remove used patches before applying a new patch    . ALBUTEROL SULFATE HFA 108 (90 BASE) MCG/ACT IN AERS Inhalation Inhale 2 puffs into the lungs every 6 (six) hours as needed. wheezing       BP 102/44  Pulse 56 Vital signs show bradycardia  Physical Exam  Nursing note and vitals reviewed. Constitutional: She appears well-developed and well-nourished.  Non-toxic appearance. She does not appear ill. No distress.       Patient drowsy opens eyes when I say her name  HENT:  Head: Normocephalic and atraumatic.  Right Ear: External ear normal.  Left Ear: External ear normal.  Nose: Nose normal. No mucosal edema or rhinorrhea.  Mouth/Throat: Mucous membranes are normal. No dental abscesses or uvula swelling.       Dry mucous membranes  Eyes: Conjunctivae and EOM are normal. Pupils are equal, round, and reactive to light.  Neck: Normal range of motion and full passive range of motion without pain. Neck supple.  Cardiovascular: Normal rate, regular rhythm and normal heart sounds.  Exam reveals no gallop and no friction rub.   No murmur heard. Pulmonary/Chest: Effort normal and breath sounds normal. No respiratory distress. She has no wheezes. She has no rhonchi. She has no rales. She exhibits no tenderness and no  crepitus.  Abdominal: Soft. Normal appearance and bowel sounds are normal. She exhibits no distension. There is no tenderness. There is no rebound and no guarding.  Musculoskeletal: Normal range of  motion. She exhibits no edema and no tenderness.       Patient has a cast on her left upper extremity. Patient awakens when I stress her pelvis and an axis of she's very uncomfortable.  Neurological: She has normal strength. No cranial nerve deficit.       Patient is sleeping quietly  Skin: Skin is warm, dry and intact. No rash noted. No erythema. There is pallor.  Psychiatric: Her speech is normal. Her mood appears not anxious.       Affect is flat    ED Course  Procedures (including critical care time) Recheck at 7:45 PM Cat scan to check pelvic area for possible fractures  Recheck at 9:42 PM Ready to go home. Starting to appear more alert now. Patient starting to get painful again and tearful. Daughter expresses that she would like her admitted to get her pain medications because "whenever she cries at the assisted living they're just going to send her back to the ER" they state they do not want her admitted to be placed in more of a nursing home setting.   Recheck at 10:10 PM    Results for orders placed during the hospital encounter of 07/06/11  BASIC METABOLIC PANEL      Component Value Range   Sodium 138  135 - 145 (mEq/L)   Potassium 3.6  3.5 - 5.1 (mEq/L)   Chloride 106  96 - 112 (mEq/L)   CO2 26  19 - 32 (mEq/L)   Glucose, Bld 119 (*) 70 - 99 (mg/dL)   BUN 19  6 - 23 (mg/dL)   Creatinine, Ser 5.78  0.50 - 1.10 (mg/dL)   Calcium 9.1  8.4 - 46.9 (mg/dL)   GFR calc non Af Amer 89 (*) >90 (mL/min)   GFR calc Af Amer >90  >90 (mL/min)   Laboratory interpretation all normal except mild hyper glycemia   Dg Pelvis 1-2 Views  07/06/2011  *RADIOLOGY REPORT*  Clinical Data: Fall.  Pelvic pain.  PELVIS - 1-2 VIEW  Comparison: None.  Findings: Pelvic rings grossly appear intact.  Hips  appear located. The hips are externally rotated at the time of imaging.  No femoral neck fracture is identified.  Faint lucency is present in the right greater trochanter which could represent a small trochanteric avulsion fracture.  CT may be useful for further evaluation if the patient has clinical symptoms in this region.  IMPRESSION: No displaced femoral neck fracture identified.  Possible avulsion of the right greater trochanter.  Original Report Authenticated By: Andreas Newport, M.D.    Ct Pelvis Wo Contrast  07/06/2011  *RADIOLOGY REPORT*  Clinical Data:  Groin pain.  Fall.  Pelvic fracture.  CT PELVIS WITHOUT CONTRAST  Technique:  Multidetector CT imaging of the pelvis was performed following the standard protocol without intravenous contrast.  Comparison:  Plain films 07/06/2011.  Findings:  The obturator rings are intact.  Calcific tendonitis of both gluteal insertions at the greater trochanter.  Common hamstring origin calcific tendonitis is also present.  Bilateral SI joint degenerative disease.  Sacrum appears intact.  The lower lumbar spondylosis and facet arthrosis.  No acute abnormality in the visceral pelvis.  Hysterectomy.  Atherosclerosis.  There is no hip fracture.  Mild bilateral hip osteoarthritis. Abnormality on prior radiograph represents calcific tendonitis, which is greater on the left than right.  There is a small distal tendinous bone fragment that accounted for the abnormality on radiograph.  Muscular compartments grossly appear within normal limits.  Sagittal images demonstrate degenerative grade 1 anterolisthesis of L4 on L5.  Broad-based calcified L3-L4 disc protrusion.  IMPRESSION: No acute osseous abnormality.  Calcific  tendonitis accounts for fragment seen on prior radiograph.  No pelvic or hip fracture.  Original Report Authenticated By: Andreas Newport, M.D.    Diagnoses that have been ruled out:  Diagnoses that are still under consideration:  Final diagnoses:  Falls  frequently  Fracture of humeral head  Elbow fracture, left  Pelvic pain in female   Plan admission for pain control   MDM     I personally performed the services described in this documentation, which was scribed in my presence. The recorded information has been reviewed and considered. Devoria Albe, MD, Armando Gang    Ward Givens, MD 07/06/11 [phone]

## 2011-07-06 NOTE — ED Notes (Addendum)
Patient's B/P 83/47. Notified Dr Fredricka Bonine. No further orders at this time. Patient sleeping in bed at this time.

## 2011-07-06 NOTE — ED Notes (Signed)
Pt ad broken shoulder several weeks ago, now having severe left shoulder pain, denies any injury, unable to control pain w. percocet

## 2011-07-07 ENCOUNTER — Encounter (HOSPITAL_COMMUNITY): Payer: Self-pay | Admitting: *Deleted

## 2011-07-07 DIAGNOSIS — F0390 Unspecified dementia without behavioral disturbance: Secondary | ICD-10-CM | POA: Diagnosis present

## 2011-07-07 LAB — COMPREHENSIVE METABOLIC PANEL
ALT: 6 U/L (ref 0–35)
AST: 12 U/L (ref 0–37)
Albumin: 1.9 g/dL — ABNORMAL LOW (ref 3.5–5.2)
Alkaline Phosphatase: 61 U/L (ref 39–117)
Calcium: 8.3 mg/dL — ABNORMAL LOW (ref 8.4–10.5)
Potassium: 3.4 mEq/L — ABNORMAL LOW (ref 3.5–5.1)
Sodium: 140 mEq/L (ref 135–145)
Total Protein: 4.9 g/dL — ABNORMAL LOW (ref 6.0–8.3)

## 2011-07-07 LAB — GLUCOSE, CAPILLARY
Glucose-Capillary: 82 mg/dL (ref 70–99)
Glucose-Capillary: 90 mg/dL (ref 70–99)
Glucose-Capillary: 124 mg/dL — ABNORMAL HIGH (ref 70–99)
Glucose-Capillary: 124 mg/dL — ABNORMAL HIGH (ref 70–99)

## 2011-07-07 LAB — CBC
HCT: 29.2 % — ABNORMAL LOW (ref 36.0–46.0)
Hemoglobin: 9.5 g/dL — ABNORMAL LOW (ref 12.0–15.0)
MCH: 31.5 pg (ref 26.0–34.0)
MCHC: 32.5 g/dL (ref 30.0–36.0)
MCV: 96.7 fL (ref 78.0–100.0)
RDW: 15 % (ref 11.5–15.5)

## 2011-07-07 LAB — HEMOGLOBIN A1C: Mean Plasma Glucose: 146 mg/dL — ABNORMAL HIGH (ref <117)

## 2011-07-07 MED ORDER — ACETAMINOPHEN 650 MG RE SUPP: 650.0000 mg | Freq: Four times a day (QID) | RECTAL | Status: DC | PRN

## 2011-07-07 MED ORDER — ONDANSETRON HCL 4 MG PO TABS: 4.0000 mg | ORAL_TABLET | Freq: Four times a day (QID) | ORAL | Status: DC | PRN

## 2011-07-07 MED ORDER — DOCUSATE SODIUM 100 MG PO CAPS
100.0000 mg | ORAL_CAPSULE | Freq: Two times a day (BID) | ORAL | Status: DC
  Administered 2011-07-07: 100 mg via ORAL
  Filled 2011-07-07 (×2): qty 1

## 2011-07-07 MED ORDER — HALOPERIDOL LACTATE 5 MG/ML IJ SOLN: 1.0000 mg | Freq: Four times a day (QID) | INTRAMUSCULAR | Status: DC | PRN

## 2011-07-07 MED ORDER — OXYCODONE HCL 5 MG PO TABS
5.0000 mg | ORAL_TABLET | Freq: Three times a day (TID) | ORAL | Status: DC
  Administered 2011-07-07: 5 mg via ORAL
  Filled 2011-07-07: qty 1

## 2011-07-07 MED ORDER — ACETAMINOPHEN 325 MG PO TABS: 650.0000 mg | ORAL_TABLET | Freq: Four times a day (QID) | ORAL | Status: DC | PRN

## 2011-07-07 MED ORDER — ALPRAZOLAM 0.25 MG PO TABS
0.2500 mg | ORAL_TABLET | Freq: Three times a day (TID) | ORAL | Status: DC
  Administered 2011-07-07 – 2011-07-09 (×7): 0.25 mg via ORAL
  Filled 2011-07-07 (×7): qty 1

## 2011-07-07 MED ORDER — ALBUTEROL SULFATE HFA 108 (90 BASE) MCG/ACT IN AERS: 2.0000 | INHALATION_SPRAY | Freq: Four times a day (QID) | RESPIRATORY_TRACT | Status: DC | PRN

## 2011-07-07 MED ORDER — OXYCODONE-ACETAMINOPHEN 5-325 MG PO TABS: 2.0000 | ORAL_TABLET | ORAL | Status: DC | PRN

## 2011-07-07 MED ORDER — ACETAMINOPHEN 325 MG PO TABS
650.0000 mg | ORAL_TABLET | Freq: Four times a day (QID) | ORAL | Status: DC
  Administered 2011-07-07 – 2011-07-09 (×10): 650 mg via ORAL
  Filled 2011-07-07 (×10): qty 2

## 2011-07-07 MED ORDER — MORPHINE SULFATE 2 MG/ML IJ SOLN
1.0000 mg | INTRAMUSCULAR | Status: DC | PRN
  Administered 2011-07-07 (×2): 1 mg via INTRAVENOUS
  Filled 2011-07-07 (×2): qty 1

## 2011-07-07 MED ORDER — SODIUM CHLORIDE 0.9 % IV SOLN
INTRAVENOUS | Status: DC
  Administered 2011-07-07 (×2): 950 mL via INTRAVENOUS
  Administered 2011-07-07 – 2011-07-08 (×2): via INTRAVENOUS

## 2011-07-07 MED ORDER — ENOXAPARIN SODIUM 40 MG/0.4ML ~~LOC~~ SOLN
40.0000 mg | Freq: Every day | SUBCUTANEOUS | Status: DC
  Administered 2011-07-07 – 2011-07-09 (×3): 40 mg via SUBCUTANEOUS
  Filled 2011-07-07 (×3): qty 0.4

## 2011-07-07 MED ORDER — FUROSEMIDE 20 MG PO TABS
20.0000 mg | ORAL_TABLET | Freq: Every day | ORAL | Status: DC
  Administered 2011-07-07: 20 mg via ORAL
  Filled 2011-07-07: qty 1

## 2011-07-07 MED ORDER — CITALOPRAM HYDROBROMIDE 20 MG PO TABS
20.0000 mg | ORAL_TABLET | Freq: Every day | ORAL | Status: DC
  Administered 2011-07-07 – 2011-07-09 (×3): 20 mg via ORAL
  Filled 2011-07-07 (×3): qty 1

## 2011-07-07 MED ORDER — SODIUM CHLORIDE 0.9 % IV SOLN: INTRAVENOUS | Status: DC

## 2011-07-07 MED ORDER — ASPIRIN EC 81 MG PO TBEC
81.0000 mg | DELAYED_RELEASE_TABLET | Freq: Every day | ORAL | Status: DC
  Administered 2011-07-07 – 2011-07-09 (×3): 81 mg via ORAL
  Filled 2011-07-07 (×3): qty 1

## 2011-07-07 MED ORDER — ALBUTEROL SULFATE (5 MG/ML) 0.5% IN NEBU: 2.5000 mg | INHALATION_SOLUTION | RESPIRATORY_TRACT | Status: DC | PRN

## 2011-07-07 MED ORDER — AMITRIPTYLINE HCL 10 MG PO TABS
10.0000 mg | ORAL_TABLET | Freq: Every day | ORAL | Status: DC
  Administered 2011-07-07 – 2011-07-08 (×2): 10 mg via ORAL
  Filled 2011-07-07 (×2): qty 1

## 2011-07-07 MED ORDER — CALCIUM CARBONATE-VITAMIN D 500-200 MG-UNIT PO TABS
1.0000 | ORAL_TABLET | Freq: Two times a day (BID) | ORAL | Status: DC
  Administered 2011-07-07: 1 via ORAL
  Filled 2011-07-07: qty 1

## 2011-07-07 MED ORDER — SENNA 8.6 MG PO TABS
1.0000 | ORAL_TABLET | Freq: Two times a day (BID) | ORAL | Status: DC
  Administered 2011-07-07: 8.6 mg via ORAL
  Filled 2011-07-07: qty 1

## 2011-07-07 MED ORDER — FLORA-Q PO CAPS
1.0000 | ORAL_CAPSULE | Freq: Every day | ORAL | Status: DC
  Administered 2011-07-07: 1 via ORAL
  Filled 2011-07-07: qty 1

## 2011-07-07 MED ORDER — INSULIN GLARGINE 100 UNIT/ML ~~LOC~~ SOLN
10.0000 [IU] | Freq: Every day | SUBCUTANEOUS | Status: DC
  Administered 2011-07-07 – 2011-07-08 (×2): 10 [IU] via SUBCUTANEOUS
  Filled 2011-07-07: qty 3

## 2011-07-07 MED ORDER — SIMVASTATIN 10 MG PO TABS: 10.0000 mg | ORAL_TABLET | Freq: Every day | ORAL | Status: DC

## 2011-07-07 MED ORDER — INSULIN ASPART 100 UNIT/ML ~~LOC~~ SOLN
0.0000 [IU] | Freq: Three times a day (TID) | SUBCUTANEOUS | Status: DC
  Administered 2011-07-07: 1 [IU] via SUBCUTANEOUS
  Filled 2011-07-07: qty 3

## 2011-07-07 MED ORDER — ONDANSETRON HCL 4 MG/2ML IJ SOLN: 4.0000 mg | Freq: Four times a day (QID) | INTRAMUSCULAR | Status: DC | PRN

## 2011-07-07 MED ORDER — BIMATOPROST 0.03 % OP SOLN
1.0000 [drp] | Freq: Every day | OPHTHALMIC | Status: DC
  Administered 2011-07-07 – 2011-07-08 (×2): 1 [drp] via OPHTHALMIC
  Filled 2011-07-07: qty 2.5

## 2011-07-07 MED ORDER — HALOPERIDOL LACTATE 5 MG/ML IJ SOLN
2.0000 mg | Freq: Four times a day (QID) | INTRAMUSCULAR | Status: DC | PRN
  Administered 2011-07-07 – 2011-07-08 (×3): 2 mg via INTRAVENOUS
  Filled 2011-07-07 (×4): qty 1

## 2011-07-07 MED ORDER — RIVASTIGMINE 9.5 MG/24HR TD PT24
9.5000 mg | MEDICATED_PATCH | Freq: Every day | TRANSDERMAL | Status: DC
  Administered 2011-07-07 – 2011-07-09 (×3): 9.5 mg via TRANSDERMAL
  Filled 2011-07-07 (×4): qty 1

## 2011-07-07 NOTE — Progress Notes (Signed)
Subjective: unable Objective: Vital signs in last 24 hours: Filed Vitals:   07/06/11 1800 07/06/11 2341 07/07/11 0020 07/07/11 0638  BP: 101/49 101/44 129/67 111/65  Pulse: 59 61 66 62  Temp:  97.8 F (36.6 C) 97.8 F (36.6 C) 98 F (36.7 C)  TempSrc:   Oral Oral  Resp:   18 18  Height:   5\' 4"  (1.626 m)   Weight:   67.6 kg (149 lb 0.5 oz)   SpO2: 94% 97% 100% 97%   Weight change:  No intake or output data in the 24 hours ending 07/07/11 1130 Physical Exam: Gen.: Agitated. Crying out nearly continuously. Confused  HEENT dry mucous membranes dentures present Lungs clear to auscultation bilaterally without wheeze rhonchi or rales Cardiovascular regular rate rhythm without murmurs gallops rubs Abdomen soft nontender nondistended Extremities left arm sling in place. No edema. Psychiatric: Agitated, anxious  Lab Results: Basic Metabolic Panel:  Lab 07/07/11 1610 07/06/11 1921  NA 140 138  K 3.4* 3.6  CL 110 106  CO2 26 26  GLUCOSE 94 119*  BUN 17 19  CREATININE 0.45* 0.54  CALCIUM 8.3* 9.1  MG -- --  PHOS -- --   Liver Function Tests:  Lab 07/07/11 0432  AST 12  ALT 6  ALKPHOS 61  BILITOT 0.3  PROT 4.9*  ALBUMIN 1.9*   No results found for this basename: LIPASE:2,AMYLASE:2 in the last 168 hours No results found for this basename: AMMONIA:2 in the last 168 hours CBC:  Lab 07/07/11 0432  WBC 3.4*  NEUTROABS --  HGB 9.5*  HCT 29.2*  MCV 96.7  PLT 282   Cardiac Enzymes: No results found for this basename: CKTOTAL:3,CKMB:3,CKMBINDEX:3,TROPONINI:3 in the last 168 hours BNP: No results found for this basename: PROBNP:3 in the last 168 hours D-Dimer: No results found for this basename: DDIMER:2 in the last 168 hours CBG:  Lab 07/06/11 2349 07/02/11 0624  GLUCAP 100* 97   Hemoglobin A1C: No results found for this basename: HGBA1C in the last 168 hours Fasting Lipid Panel: No results found for this basename: CHOL,HDL,LDLCALC,TRIG,CHOLHDL,LDLDIRECT in  the last 168 hours Thyroid Function Tests: No results found for this basename: TSH,T4TOTAL,FREET4,T3FREE,THYROIDAB in the last 168 hours Coagulation: No results found for this basename: LABPROT:4,INR:4 in the last 168 hours Anemia Panel: No results found for this basename: VITAMINB12,FOLATE,FERRITIN,TIBC,IRON,RETICCTPCT in the last 168 hours Urine Drug Screen: Drugs of Abuse  No results found for this basename: labopia, cocainscrnur, labbenz, amphetmu, thcu, labbarb    Alcohol Level: No results found for this basename: ETH:2 in the last 168 hours Urinalysis: Specific gravity greater than 1.030. Negative otherwise.  Micro Results: No results found for this or any previous visit (from the past 240 hour(s)). Studies/Results: Dg Pelvis 1-2 Views  07/06/2011  *RADIOLOGY REPORT*  Clinical Data: Fall.  Pelvic pain.  PELVIS - 1-2 VIEW  Comparison: None.  Findings: Pelvic rings grossly appear intact.  Hips appear located. The hips are externally rotated at the time of imaging.  No femoral neck fracture is identified.  Faint lucency is present in the right greater trochanter which could represent a small trochanteric avulsion fracture.  CT may be useful for further evaluation if the patient has clinical symptoms in this region.  IMPRESSION: No displaced femoral neck fracture identified.  Possible avulsion of the right greater trochanter.  Original Report Authenticated By: Andreas Newport, M.D.   Ct Pelvis Wo Contrast  07/06/2011  *RADIOLOGY REPORT*  Clinical Data:  Groin pain.  Fall.  Pelvic fracture.  CT PELVIS WITHOUT CONTRAST  Technique:  Multidetector CT imaging of the pelvis was performed following the standard protocol without intravenous contrast.  Comparison:  Plain films 07/06/2011.  Findings:  The obturator rings are intact.  Calcific tendonitis of both gluteal insertions at the greater trochanter.  Common hamstring origin calcific tendonitis is also present.  Bilateral SI joint degenerative  disease.  Sacrum appears intact.  The lower lumbar spondylosis and facet arthrosis.  No acute abnormality in the visceral pelvis.  Hysterectomy.  Atherosclerosis.  There is no hip fracture.  Mild bilateral hip osteoarthritis. Abnormality on prior radiograph represents calcific tendonitis, which is greater on the left than right.  There is a small distal tendinous bone fragment that accounted for the abnormality on radiograph.  Muscular compartments grossly appear within normal limits.  Sagittal images demonstrate degenerative grade 1 anterolisthesis of L4 on L5.  Broad-based calcified L3-L4 disc protrusion.  IMPRESSION: No acute osseous abnormality.  Calcific  tendonitis accounts for fragment seen on prior radiograph.  No pelvic or hip fracture.  Original Report Authenticated By: Andreas Newport, M.D.   Medications: I have reviewed the patient's current medications. Scheduled Meds:   . acetaminophen  650 mg Oral QID  . ALPRAZolam  0.25 mg Oral TID  . amitriptyline  10 mg Oral QHS  . aspirin EC  81 mg Oral Daily  . bimatoprost  1 drop Both Eyes QHS  . citalopram  20 mg Oral Daily  . docusate sodium  100 mg Oral BID  . enoxaparin  40 mg Subcutaneous Daily  . insulin aspart  0-9 Units Subcutaneous TID WC  . insulin glargine  10 Units Subcutaneous QHS  .  morphine injection  2 mg Intravenous Once  . ondansetron (ZOFRAN) IV  4 mg Intravenous Once  . oxyCODONE-acetaminophen  2 tablet Oral Once  . rivastigmine  9.5 mg Transdermal Daily  . senna  1 tablet Oral BID  . sodium chloride  1,000 mL Intravenous Once  . DISCONTD: calcium-vitamin D  1 tablet Oral BID  . DISCONTD: [place]-Q  1 capsule Oral Q breakfast  . DISCONTD: furosemide  20 mg Oral Daily  . DISCONTD: morphine  2 mg Intravenous Once  . DISCONTD:  morphine injection  4 mg Intravenous Once  . DISCONTD: oxyCODONE  5 mg Oral TID  . DISCONTD: simvastatin  10 mg Oral q1800   Continuous Infusions:   . sodium chloride 950 mL (07/07/11 0847)    . DISCONTD: sodium chloride    . DISCONTD: sodium chloride     PRN Meds:.albuterol, haloperidol lactate, ondansetron (ZOFRAN) IV, ondansetron, DISCONTD: acetaminophen, DISCONTD: acetaminophen, DISCONTD: albuterol, DISCONTD: haloperidol lactate, DISCONTD: morphine, DISCONTD: oxyCODONE-acetaminophen Assessment/Plan: Active Problems:  Delirium, in, senile dementia  Humerus fracture  Alzheimer disease  Anxiety disorder  DM type 2 (diabetes mellitus, type 2)  Fall  Physical deconditioning  Glaucoma  dehydration  Long discussion with family members. They report that her dementia seems to have been accelerating particularly at night time even prior to her falls and increase in benzodiazepines. They report that she is usually disoriented to time but she has been more disoriented to place as well. She is usually able to perform activities of daily living independently. Her crying spells excelerated after her the institution of Percocet. They also noted that she was hallucinating and confused. She most likely has worsening dementia with probably opiate-related acute delirium. I believe that her crying spells and agitation is related to medications, rather than pain per se.  Will try to with hold opiate analgesics for now and give Haldol as needed. She may end up needing antipsychotics long term. EKG is pending. Family wishes that she be full code but reports that she would not want to remain on life support should she have a terminal condition or be in a vegetative state.  Will check TSH, B12, folate   LOS: 1 day   Myliyah Rebuck L 07/07/2011, 11:30 AM

## 2011-07-07 NOTE — Progress Notes (Signed)
Patient has splint/sling to left arm

## 2011-07-08 LAB — GLUCOSE, CAPILLARY
Glucose-Capillary: 105 mg/dL — ABNORMAL HIGH (ref 70–99)
Glucose-Capillary: 120 mg/dL — ABNORMAL HIGH (ref 70–99)

## 2011-07-08 MED ORDER — SENNA 8.6 MG PO TABS: 2.0000 | ORAL_TABLET | Freq: Every day | ORAL | Status: DC | PRN

## 2011-07-08 NOTE — Progress Notes (Signed)
This night shift, patient had loose stools four times. Doctor was notified to get an order for c. Diff screening. New order was given.

## 2011-07-08 NOTE — Progress Notes (Signed)
Physical Therapy Evaluation Patient Details Name: Michelle Sweeney MRN: [id number] DOB: January 22, 1933 Today's Date: 07/08/2011  Problem List:  Patient Active Problem List  Diagnoses  . Fall  . Physical deconditioning  . Humerus fracture  . Alzheimer disease  . Anxiety disorder  . Glaucoma  . DM type 2 (diabetes mellitus, type 2)  . Delirium, in, senile dementia    Past Medical History:  Past Medical History  Diagnosis Date  . IBS (irritable bowel syndrome)   . HTN (hypertension)   . Osteoporosis   . Hyperlipidemia   . Hyperkalemia   . Diabetes mellitus   . Alzheimer disease   . Arthritis    Past Surgical History:  Past Surgical History  Procedure Date  . Vascular surgery   . Abdominal hysterectomy     PT Assessment/Plan/Recommendation PT Assessment Clinical Impression Statement: very pleasant pt with significant dementia, however able to follow all directions...she is very deconditioned from recent illness and extremely unsteady upon standing...nin to mod assist needed with all mobility and unable to ambulate at this time.Marland KitchenMarland KitchenI am strongly recommending SNF at d/c.Marland KitchenMarland Kitchenpt and son agreeable PT Recommendation/Assessment: Patient will need skilled PT in the acute care venue PT Problem List: Decreased activity tolerance;Decreased balance;Decreased mobility;Decreased cognition;Decreased knowledge of use of DME;Decreased safety awareness Barriers to Discharge: None PT Therapy Diagnosis : Difficulty walking;Generalized weakness PT Plan PT Frequency: Min 3X/week PT Treatment/Interventions: Gait training;Functional mobility training;DME instruction;Therapeutic activities;Therapeutic exercise;Balance training;Patient/family education PT Recommendation Recommendations for Other Services: OT consult Follow Up Recommendations: Skilled nursing facility Equipment Recommended: Defer to next venue PT Goals  Acute Rehab PT Goals PT Goal Formulation: With patient/family Time For Goal  Achievement: 2 weeks Pt will go Supine/Side to Sit: with min assist Pt will go Sit to Supine/Side: with min assist Pt will go Sit to Stand: with supervision Pt will go Stand to Sit: with supervision Pt will Ambulate: 1 - 15 feet;with least restrictive assistive device;with mod assist  PT Evaluation Precautions/Restrictions  Precautions Precautions: Fall Required Braces or Orthoses: No Restrictions Weight Bearing Restrictions: Yes LUE Weight Bearing: Non weight bearing Other Position/Activity Restrictions: pt with fx of L humerus and elbow-in sling Prior Functioning  Home Living Receives Help From: Personal care attendant Type of Home: Assisted living Home Layout: One level Home Access: Level entry Prior Function Level of Independence: Independent with basic ADLs;Independent with gait;Independent with transfers Driving: No Vocation: Retired Producer, television/film/video: Awake/alert Overall Cognitive Status: History of cognitive impairments History of Cognitive Impairment: Appears at baseline functioning Orientation Level: Oriented to person;Disoriented to place;Disoriented to time;Disoriented to situation Sensation/Coordination Sensation Light Touch: Not tested Stereognosis: Not tested Hot/Cold: Not tested Proprioception: Not tested Coordination Gross Motor Movements are Fluid and Coordinated: Not tested Fine Motor Movements are Fluid and Coordinated: Not tested Extremity Assessment RUE Assessment RUE Assessment: Within Functional Limits LUE Assessment LUE Assessment: Not tested RLE Assessment RLE Assessment: Within Functional Limits LLE Assessment LLE Assessment: Within Functional Limits Mobility (including Balance) Bed Mobility Bed Mobility: Yes Supine to Sit: 4: Min assist Supine to Sit Details (indicate cue type and reason): LUE in sling causes shift in weight bearing...has difficulty bringing weight forward Sitting - Scoot to Edge of Bed: 4: Min  assist Transfers Transfers: Yes Sit to Stand: 3: Mod assist;From bed Sit to Stand Details (indicate cue type and reason): very unsteady from general deconditioning Stand to Sit: 4: Min assist;To bed Stand Pivot Transfers: 4: Min assist Ambulation/Gait Ambulation/Gait: No (unable) Stairs: No Wheelchair Mobility Wheelchair Mobility: No  Posture/Postural  Control Posture/Postural Control: No significant limitations Balance Balance Assessed: No Exercise    End of Session PT - End of Session Equipment Utilized During Treatment: Gait belt Activity Tolerance: Patient tolerated treatment well Patient left: in chair;with call bell in reach;with bed alarm set Nurse Communication: Mobility status for transfers General Behavior During Session: [facility] for tasks performed Cognition: [facility] for tasks performed  Konrad Penta 07/08/2011, 9:32 AM

## 2011-07-08 NOTE — Progress Notes (Signed)
Subjective: Denies pain. Per nursing, has had no diarrhea today. Has had no crying spells. Tolerating a diet. Able to follow commands. No longer hallucinating. Her physical therapy, patient was able to follow instructions, however she is at high risk for fall and skilled nursing facility is recommended. Objective: Vital signs in last 24 hours: Filed Vitals:   07/07/11 1400 07/07/11 2015 07/08/11 0533 07/08/11 0635  BP: 123/67 158/4  130/68  Pulse: 81 75 98 86  Temp: 99.5 F (37.5 C) 97.9 F (36.6 C) 97.9 F (36.6 C)   TempSrc:  Other (Comment) Oral   Resp: 20 18 20    Height:      Weight:      SpO2: 100%      Weight change:   Intake/Output Summary (Last 24 hours) at 07/08/11 1308 Last data filed at 07/08/11 1300  Gross per 24 hour  Intake 2121.25 ml  Output   3450 ml  Net -1328.75 ml   Physical Exam: Gen.: Calm. Sitting in a chair. Watching TV. Conversant and appropriate. HEENT MUCOUS membranes less dry. Lungs clear to auscultation bilaterally without wheeze rhonchi or rales Cardiovascular regular rate rhythm without murmurs gallops rubs Abdomen soft nontender nondistended Extremities left arm sling in place. No edema. Psychiatric: Smiling. Normal affect. No anxiety or agitation noted  Lab Results: Basic Metabolic Panel:  Lab 07/07/11 1914 07/06/11 1921  NA 140 138  K 3.4* 3.6  CL 110 106  CO2 26 26  GLUCOSE 94 119*  BUN 17 19  CREATININE 0.45* 0.54  CALCIUM 8.3* 9.1  MG -- --  PHOS -- --   Liver Function Tests:  Lab 07/07/11 0432  AST 12  ALT 6  ALKPHOS 61  BILITOT 0.3  PROT 4.9*  ALBUMIN 1.9*   No results found for this basename: LIPASE:2,AMYLASE:2 in the last 168 hours No results found for this basename: AMMONIA:2 in the last 168 hours CBC:  Lab 07/07/11 0432  WBC 3.4*  NEUTROABS --  HGB 9.5*  HCT 29.2*  MCV 96.7  PLT 282   Cardiac Enzymes: No results found for this basename: CKTOTAL:3,CKMB:3,CKMBINDEX:3,TROPONINI:3 in the last 168  hours BNP: No results found for this basename: PROBNP:3 in the last 168 hours D-Dimer: No results found for this basename: DDIMER:2 in the last 168 hours CBG:  Lab 07/08/11 1110 07/08/11 0728 07/07/11 2141 07/07/11 1610 07/07/11 1111 07/07/11 0844  GLUCAP 120* 105* 124* 124* 90 82   Hemoglobin A1C:  Lab 07/07/11 0432  HGBA1C 6.7*   Fasting Lipid Panel: No results found for this basename: CHOL,HDL,LDLCALC,TRIG,CHOLHDL,LDLDIRECT in the last 782 hours Thyroid Function Tests: No results found for this basename: TSH,T4TOTAL,FREET4,T3FREE,THYROIDAB in the last 168 hours Coagulation: No results found for this basename: LABPROT:4,INR:4 in the last 168 hours Anemia Panel: No results found for this basename: VITAMINB12,FOLATE,FERRITIN,TIBC,IRON,RETICCTPCT in the last 168 hours Urine Drug Screen: Drugs of Abuse  No results found for this basename: labopia,  cocainscrnur,  labbenz,  amphetmu,  thcu,  labbarb    Alcohol Level: No results found for this basename: ETH:2 in the last 168 hours Urinalysis: Specific gravity greater than 1.030. Negative otherwise.  Micro Results: Recent Results (from the past 240 hour(s))  URINE CULTURE     Status: Normal (Preliminary result)   Collection Time   07/06/11 10:40 PM      Component Value Range Status Comment   Specimen Description URINE, CATHETERIZED   Final    Special Requests NONE   Final    Setup Time [phone]  Final    Colony Count PENDING   Incomplete    Culture Culture reincubated for better growth   Final    Report Status PENDING   Incomplete   CLOSTRIDIUM DIFFICILE BY PCR     Status: Normal   Collection Time   07/07/11 11:15 PM      Component Value Range Status Comment   C difficile by pcr NEGATIVE  NEGATIVE  Final    Studies/Results: Dg Pelvis 1-2 Views  07/06/2011  *RADIOLOGY REPORT*  Clinical Data: Fall.  Pelvic pain.  PELVIS - 1-2 VIEW  Comparison: None.  Findings: Pelvic rings grossly appear intact.  Hips appear  located. The hips are externally rotated at the time of imaging.  No femoral neck fracture is identified.  Faint lucency is present in the right greater trochanter which could represent a small trochanteric avulsion fracture.  CT may be useful for further evaluation if the patient has clinical symptoms in this region.  IMPRESSION: No displaced femoral neck fracture identified.  Possible avulsion of the right greater trochanter.  Original Report Authenticated By: Andreas Newport, M.D.   Ct Pelvis Wo Contrast  07/06/2011  *RADIOLOGY REPORT*  Clinical Data:  Groin pain.  Fall.  Pelvic fracture.  CT PELVIS WITHOUT CONTRAST  Technique:  Multidetector CT imaging of the pelvis was performed following the standard protocol without intravenous contrast.  Comparison:  Plain films 07/06/2011.  Findings:  The obturator rings are intact.  Calcific tendonitis of both gluteal insertions at the greater trochanter.  Common hamstring origin calcific tendonitis is also present.  Bilateral SI joint degenerative disease.  Sacrum appears intact.  The lower lumbar spondylosis and facet arthrosis.  No acute abnormality in the visceral pelvis.  Hysterectomy.  Atherosclerosis.  There is no hip fracture.  Mild bilateral hip osteoarthritis. Abnormality on prior radiograph represents calcific tendonitis, which is greater on the left than right.  There is a small distal tendinous bone fragment that accounted for the abnormality on radiograph.  Muscular compartments grossly appear within normal limits.  Sagittal images demonstrate degenerative grade 1 anterolisthesis of L4 on L5.  Broad-based calcified L3-L4 disc protrusion.  IMPRESSION: No acute osseous abnormality.  Calcific  tendonitis accounts for fragment seen on prior radiograph.  No pelvic or hip fracture.  Original Report Authenticated By: Andreas Newport, M.D.   Scheduled Meds:    . acetaminophen  650 mg Oral QID  . ALPRAZolam  0.25 mg Oral TID  . amitriptyline  10 mg Oral QHS   . aspirin EC  81 mg Oral Daily  . bimatoprost  1 drop Both Eyes QHS  . citalopram  20 mg Oral Daily  . docusate sodium  100 mg Oral BID  . enoxaparin  40 mg Subcutaneous Daily  . insulin aspart  0-9 Units Subcutaneous TID WC  . insulin glargine  10 Units Subcutaneous QHS  . rivastigmine  9.5 mg Transdermal Daily  . senna  1 tablet Oral BID   Continuous Infusions:    . sodium chloride 75 mL/hr at 07/08/11 0618   PRN Meds:.albuterol, haloperidol lactate, ondansetron (ZOFRAN) IV, ondansetron Assessment/Plan: Active Problems:  Delirium, in, senile dementia  Humerus fracture  Alzheimer disease  Anxiety disorder  DM type 2 (diabetes mellitus, type 2)  Fall  Physical deconditioning  Glaucoma  dehydration  Acute delirium has resolved. Will continue current regimen. Patient's pain is currently well controlled. Avoid opiate analgesics at all costs. She continues to be at risk for fall however and would benefit from short-term  skilled nursing facility. Spoke with the daughter and she is in agreement. Followup B12 and folate.    LOS: 2 days   Minha Fulco L 07/08/2011, 1:08 PM

## 2011-07-09 ENCOUNTER — Inpatient Hospital Stay
Admission: RE | Admit: 2011-07-09 | Discharge: 2011-07-20 | Disposition: A | Discharge status: Nursing Home (Includes ICF) | Payer: PRIVATE HEALTH INSURANCE | Source: Ambulatory Visit | Attending: Internal Medicine | Admitting: Internal Medicine

## 2011-07-09 LAB — BASIC METABOLIC PANEL
BUN: 9 mg/dL (ref 6–23)
Calcium: 8.4 mg/dL (ref 8.4–10.5)
GFR calc Af Amer: >90 mL/min (ref >90)
GFR calc non Af Amer: >90 mL/min (ref >90)
Potassium: 2.4 mEq/L — CL (ref 3.5–5.1)
Sodium: 142 mEq/L (ref 135–145)

## 2011-07-09 LAB — CBC
Hemoglobin: 10.4 g/dL — ABNORMAL LOW (ref 12.0–15.0)
MCH: 31.4 pg (ref 26.0–34.0)
MCHC: 33.5 g/dL (ref 30.0–36.0)
Platelets: 314 10*3/uL (ref 150–400)
RBC: 3.31 MIL/uL — ABNORMAL LOW (ref 3.87–5.11)

## 2011-07-09 LAB — MAGNESIUM: Magnesium: 2 mg/dL (ref 1.5–2.5)

## 2011-07-09 LAB — GLUCOSE, CAPILLARY: Glucose-Capillary: 93 mg/dL (ref 70–99)

## 2011-07-09 MED ORDER — ACETAMINOPHEN 325 MG PO TABS: 650.0000 mg | ORAL_TABLET | Freq: Three times a day (TID) | ORAL | Status: DC

## 2011-07-09 MED ORDER — POTASSIUM CHLORIDE CRYS ER 20 MEQ PO TBCR
40.0000 meq | EXTENDED_RELEASE_TABLET | Freq: Four times a day (QID) | ORAL | Status: DC
  Administered 2011-07-09 (×3): 40 meq via ORAL
  Filled 2011-07-09: qty 1
  Filled 2011-07-09 (×2): qty 2

## 2011-07-09 MED ORDER — LOPERAMIDE HCL 2 MG PO CAPS: 4.0000 mg | ORAL_CAPSULE | Freq: Three times a day (TID) | ORAL | Status: DC | PRN

## 2011-07-09 MED ORDER — ALPRAZOLAM 0.25 MG PO TABS: 0.2500 mg | ORAL_TABLET | Freq: Two times a day (BID) | ORAL | Status: DC

## 2011-07-09 NOTE — Progress Notes (Signed)
Physical Therapy Treatment Patient Details Name: Michelle Sweeney MRN: [id number] DOB: 06-02-1933 Today's Date: 07/09/2011 Time in/out:  9:28-10:00 am (32 minutes) Charges:  There ex 12', there act 16'  PT Assessment/Plan  PT - Assessment/Plan Comments on Treatment Session: Pt. able to ambulate today bed-->BSC-->chair, approx 10 feet total.  Pt with overall instability with ambulation, requiring mod assist with HHA.  Will try using SPC next visit to see if able to maintain balance better.  Pt. able to complete all supine therex, but limited bilateral knee flexion to perform bridge.  Pt  in recliner with OT upon leaving. PT Plan: Frequency remains appropriate Equipment Recommended: Defer to next venue  PT Treatment Precautions/Restrictions  Precautions Precautions: Fall Required Braces or Orthoses: No Restrictions Weight Bearing Restrictions: Yes LUE Weight Bearing: Non weight bearing Other Position/Activity Restrictions: Left proximal humerus fracture with arm in cast and sling in place. Mobility (including Balance) Bed Mobility Bed Mobility: Yes Supine to Sit: 4: Min assist Supine to Sit Details (indicate cue type and reason): Pt. with difficulty getting to EOB today; possibly due to stiffness Sitting - Scoot to Edge of Bed: 3: Mod assist Sitting - Scoot to Edge of Bed Details (indicate cue type and reason): Pt. required assistance, stating her bottom was "stuck" to the bed Transfers Sit to Stand: 4: Min assist Sit to Stand Details (indicate cue type and reason): Pt. unsteady upon initial standing. Stand to Sit: 4: Min assist Stand to Sit Details: Pt. ambulated to [facility] and had loose BM (nursing notified) Ambulation/Gait Ambulation/Gait: Yes Ambulation/Gait Assistance: 3: Mod assist Ambulation/Gait Assistance Details (indicate cue type and reason): Pt with forward posture with unstable gait.  Ambulated 5 steps to/from [facility] with HHA (unable to use RW due to broken/casted L UE).  Pt.  able to correct posture with cues but unable to maintain with ambulation. Ambulation Distance (Feet): 10 Feet (5 feet X 2) Assistive device: 1 person hand held assist Gait Pattern: Decreased step length - left;Lateral trunk lean to right;Lateral trunk lean to left;Trunk flexed Gait velocity: slow, cautious  Balance Balance Assessed: Yes Exercise  General Exercises - Lower Extremity Ankle Circles/Pumps: AROM;Strengthening;Both;10 reps Heel Slides: AROM;Strengthening;Both;10 reps Straight Leg Raises: AROM;Strengthening;Both;10 reps End of Session PT - End of Session Equipment Utilized During Treatment: Gait belt Activity Tolerance: Patient tolerated treatment well Patient left: in chair;with call bell in reach;with family/visitor present (OT and nurse with patient upon leaving) Nurse Communication: Mobility status for transfers;Mobility status for ambulation (informed of large, loose BM) General Behavior During Session: [facility] for tasks performed Cognition: [facility] for tasks performed  Orlin Kann B. Bascom Levels, PTA

## 2011-07-09 NOTE — Progress Notes (Signed)
Pt to D/C today to the [facility].  CSW spoke with staff at [facility] and with Pt's daughter. Both are in agreement with D/C plan.  Pt to be transported by hospital staff.  CSW will sign off at this time.

## 2011-07-09 NOTE — Progress Notes (Signed)
CRITICAL VALUE ALERT  Critical value received: K 2.4   Date of notification: 07/09/11  Time of notification:  0735  Critical value read back:yes  Nurse who received alert:  j shelton rn  MD notified (1st page):  sullivan  Time of first page:  0740  MD notified (2nd page):  Time of second page:  Responding MD:  Lendell Caprice  Time MD responded: [phone] (orders placed by MD)

## 2011-07-09 NOTE — Discharge Summary (Signed)
Physician Discharge Summary  Patient ID: Michelle Sweeney MRN: [id number] DOB/AGE: 75/19/1934 75 y.o.  Admit date: 07/06/2011 Discharge date: 07/09/2011  Discharge Diagnoses:  Active Problems:  Delirium, in, senile dementia  Humerus fracture  Alzheimer disease  Anxiety disorder  DM type 2 (diabetes mellitus, type 2)  Fall  Physical deconditioning  Glaucoma   Current Discharge Medication List    CONTINUE these medications which have CHANGED   Details  acetaminophen (TYLENOL) 325 MG tablet Take 2 tablets (650 mg total) by mouth 3 (three) times daily. Qty: 30 tablet    ALPRAZolam (XANAX) 0.25 MG tablet Take 1 tablet (0.25 mg total) by mouth 2 (two) times daily. Qty: 30 tablet, Refills: 0    loperamide (IMODIUM) 2 MG capsule Take 2 capsules (4 mg total) by mouth 3 (three) times daily as needed for diarrhea or loose stools. Loose stool Qty: 30 capsule      CONTINUE these medications which have NOT CHANGED   Details  amitriptyline (ELAVIL) 10 MG tablet Take 10 mg by mouth at bedtime. For IBS    aspirin EC 81 MG tablet Take 81 mg by mouth every morning.     bimatoprost (LUMIGAN) 0.03 % ophthalmic solution Place 1 drop into both eyes at bedtime.      Calcium Carbonate-Vitamin D (CALCIUM 600 + D PO) Take 1 tablet by mouth 2 (two) times daily.      cholecalciferol (VITAMIN D) 1000 UNITS tablet Take 1,000 Units by mouth daily.      citalopram (CELEXA) 20 MG tablet Take 20 mg by mouth daily.      ibuprofen (ADVIL,MOTRIN) 400 MG tablet Take 1 tablet (400 mg total) by mouth every 8 (eight) hours as needed for pain. Qty: 20 tablet, Refills: 0    insulin glargine (LANTUS) 100 UNIT/ML injection Inject 10 Units into the skin at bedtime.      Lactobacillus (FLORAJEN ACIDOPHILUS) CAPS Take 1 capsule by mouth every morning.     lovastatin (MEVACOR) 20 MG tablet Take 20 mg by mouth at bedtime.      Polyethyl Glycol-Propyl Glycol (SYSTANE) 0.4-0.3 % SOLN Place 1 drop into the right eye  3 (three) times daily. UNTIL GONE    potassium chloride (KLOR-CON) 20 MEQ packet Take 20 mEq by mouth every morning.     rivastigmine (EXELON) 9.5 mg/24hr Place 1 patch onto the skin daily. Remove used patches before applying a new patch    albuterol (PROVENTIL HFA;VENTOLIN HFA) 108 (90 BASE) MCG/ACT inhaler Inhale 2 puffs into the lungs every 6 (six) hours as needed. wheezing       STOP taking these medications     furosemide (LASIX) 20 MG tablet      oxyCODONE-acetaminophen (PERCOCET) 5-325 MG per tablet      promethazine (PHENERGAN) 25 MG tablet         Discharge Orders    Future Orders Please Complete By Expires   Diet - low sodium heart healthy      Walk with assistance      Discharge instructions      Comments:   Keep left arm in sling      Follow-up Information    Follow up with DANIEL,TERRY in 2 weeks.      Follow up with your orthopedic surgeon in 2 weeks.         Disposition: Long Term Care  Discharged Condition: Stable  Consults:   physical therapy occupational therapy  Labs:   Results for orders placed during  the hospital encounter of 07/06/11 (from the past 48 hour(s))  GLUCOSE, CAPILLARY     Status: Abnormal   Collection Time   07/07/11  4:10 PM      Component Value Range Comment   Glucose-Capillary 124 (*) 70 - 99 (mg/dL)   GLUCOSE, CAPILLARY     Status: Abnormal   Collection Time   07/07/11  9:41 PM      Component Value Range Comment   Glucose-Capillary 124 (*) 70 - 99 (mg/dL)   CLOSTRIDIUM DIFFICILE BY PCR     Status: Normal   Collection Time   07/07/11 11:15 PM      Component Value Range Comment   C difficile by pcr NEGATIVE  NEGATIVE    GLUCOSE, CAPILLARY     Status: Abnormal   Collection Time   07/08/11  7:28 AM      Component Value Range Comment   Glucose-Capillary 105 (*) 70 - 99 (mg/dL)    Comment 1 Notify RN     GLUCOSE, CAPILLARY     Status: Abnormal   Collection Time   07/08/11 11:10 AM      Component Value Range Comment     Glucose-Capillary 120 (*) 70 - 99 (mg/dL)    Comment 1 Notify RN     BASIC METABOLIC PANEL     Status: Abnormal   Collection Time   07/09/11  5:02 AM      Component Value Range Comment   Sodium 142  135 - 145 (mEq/L)    Potassium 2.4 (*) 3.5 - 5.1 (mEq/L)    Chloride 112  96 - 112 (mEq/L)    CO2 23  19 - 32 (mEq/L)    Glucose, Bld 101 (*) 70 - 99 (mg/dL)    BUN 9  6 - 23 (mg/dL)    Creatinine, Ser 1.61 (*) 0.50 - 1.10 (mg/dL)    Calcium 8.4  8.4 - 10.5 (mg/dL)    GFR calc non Af Amer >90  >90 (mL/min)    GFR calc Af Amer >90  >90 (mL/min)   CBC     Status: Abnormal   Collection Time   07/09/11  5:02 AM      Component Value Range Comment   WBC 4.1  4.0 - 10.5 (K/uL)    RBC 3.31 (*) 3.87 - 5.11 (MIL/uL)    Hemoglobin 10.4 (*) 12.0 - 15.0 (g/dL)    HCT 09.6 (*) 04.5 - 46.0 (%)    MCV 93.7  78.0 - 100.0 (fL)    MCH 31.4  26.0 - 34.0 (pg)    MCHC 33.5  30.0 - 36.0 (g/dL)    RDW 40.9  81.1 - 91.4 (%)    Platelets 314  150 - 400 (K/uL)   MAGNESIUM     Status: Normal   Collection Time   07/09/11  5:02 AM      Component Value Range Comment   Magnesium 2.0  1.5 - 2.5 (mg/dL)   GLUCOSE, CAPILLARY     Status: Normal   Collection Time   07/09/11  7:23 AM      Component Value Range Comment   Glucose-Capillary 93  70 - 99 (mg/dL)    Comment 1 Notify RN     GLUCOSE, CAPILLARY     Status: Abnormal   Collection Time   07/09/11 11:14 AM      Component Value Range Comment   Glucose-Capillary 145 (*) 70 - 99 (mg/dL)    Comment 1 Notify RN  Diagnostics:  Dg Chest 2 View  06/30/2011  *RADIOLOGY REPORT*  Clinical Data: Trauma secondary to a fall today.  CHEST - 2 VIEW  Comparison: 10/28/2010  Findings: Heart size and vascularity are normal and the lungs are clear.  Old mild compression fractures of T11 and T12.  No acute osseous abnormality.  IMPRESSION: No acute disease in the chest.  Original Report Authenticated By: Gwynn Burly, M.D.   Dg Pelvis 1-2 Views  07/06/2011   *RADIOLOGY REPORT*  Clinical Data: Fall.  Pelvic pain.  PELVIS - 1-2 VIEW  Comparison: None.  Findings: Pelvic rings grossly appear intact.  Hips appear located. The hips are externally rotated at the time of imaging.  No femoral neck fracture is identified.  Faint lucency is present in the right greater trochanter which could represent a small trochanteric avulsion fracture.  CT may be useful for further evaluation if the patient has clinical symptoms in this region.  IMPRESSION: No displaced femoral neck fracture identified.  Possible avulsion of the right greater trochanter.  Original Report Authenticated By: Andreas Newport, M.D.   Dg Elbow Complete Left  07/02/2011  *RADIOLOGY REPORT*  Clinical Data: Recent fall, pain  LEFT ELBOW - COMPLETE 3+ VIEW  Comparison: None.  Findings: On the lateral view, there is displacement of the anterior fat pad consistent with joint space effusion.  On two views there is irregularity of the cortical margin of the distal left humerus above the lateral epicondyle consistent with distal humeral fracture.  No malalignment is seen.  IMPRESSION: Left elbow joint effusion with apparent nondisplaced fracture of the distal left humerus.  Original Report Authenticated By: Juline Patch, M.D.   Dg Wrist Complete Left  07/02/2011  *RADIOLOGY REPORT*  Clinical Data: Larey Seat today with pain  LEFT WRIST - COMPLETE 3+ VIEW  Comparison: None.  Findings: There is some deformity of the distal left radius and ulna probably due to prior trauma with healing.  There is also an ulnar positive variance which can result in ulnar impaction syndrome and instability.  There is soft tissue swelling over the ulnar aspect of the wrist but no acute fracture is seen.  Alignment is normal.  IMPRESSION:  1.  No acute fracture.  Ulnar positive variance is related with ulnar impaction syndrome and instability. 2.  Probable old fracture deformity of the distal radius and ulna.  Original Report Authenticated By:  Juline Patch, M.D.   Ct Head Wo Contrast  06/30/2011  *RADIOLOGY REPORT*  Clinical Data:  Fall, abrasion right cheek, hypertension, diabetes, patient does not remember where happened  CT HEAD WITHOUT CONTRAST CT CERVICAL SPINE WITHOUT CONTRAST  Technique:  Multidetector CT imaging of the head and cervical spine was performed following the standard protocol without intravenous contrast.  Multiplanar CT image reconstructions of the cervical spine were also generated.  Comparison:  11/09/2009  CT HEAD  Findings: Generalized atrophy. Normal ventricular morphology. No midline shift or mass effect. Small vessel chronic ischemic changes of deep cerebral white matter. No intracranial hemorrhage, mass lesion, or acute infarction. Visualized paranasal sinuses and mastoid air cells clear. Bones demineralized. Atherosclerotic calcification of internal carotid and vertebral arteries at skull base.  IMPRESSION: Atrophy with small vessel chronic ischemic changes of deep cerebral white matter. No acute intracranial abnormalities.  CT CERVICAL SPINE  Findings: Atherosclerotic calcifications within the carotid arterial systems in the neck bilaterally. Visualized skull base intact. Diffuse osseous demineralization. Multilevel disc space narrowing endplate spur formation. Diffuse facet degenerative changes throughout the  cervical spine. Prevertebral soft tissues normal thickness. Vertebral body heights maintained without fracture or subluxation. Lung apices clear. No acute bony abnormalities identified.  IMPRESSION: Multilevel degenerative disc and facet disease changes of the cervical spine. Osseous demineralization. No acute cervical spine abnormalities.  Original Report Authenticated By: Lollie Marrow, M.D.   Ct Cervical Spine Wo Contrast  06/30/2011  *RADIOLOGY REPORT*  Clinical Data:  Fall, abrasion right cheek, hypertension, diabetes, patient does not remember where happened  CT HEAD WITHOUT CONTRAST CT CERVICAL SPINE  WITHOUT CONTRAST  Technique:  Multidetector CT imaging of the head and cervical spine was performed following the standard protocol without intravenous contrast.  Multiplanar CT image reconstructions of the cervical spine were also generated.  Comparison:  11/09/2009  CT HEAD  Findings: Generalized atrophy. Normal ventricular morphology. No midline shift or mass effect. Small vessel chronic ischemic changes of deep cerebral white matter. No intracranial hemorrhage, mass lesion, or acute infarction. Visualized paranasal sinuses and mastoid air cells clear. Bones demineralized. Atherosclerotic calcification of internal carotid and vertebral arteries at skull base.  IMPRESSION: Atrophy with small vessel chronic ischemic changes of deep cerebral white matter. No acute intracranial abnormalities.  CT CERVICAL SPINE  Findings: Atherosclerotic calcifications within the carotid arterial systems in the neck bilaterally. Visualized skull base intact. Diffuse osseous demineralization. Multilevel disc space narrowing endplate spur formation. Diffuse facet degenerative changes throughout the cervical spine. Prevertebral soft tissues normal thickness. Vertebral body heights maintained without fracture or subluxation. Lung apices clear. No acute bony abnormalities identified.  IMPRESSION: Multilevel degenerative disc and facet disease changes of the cervical spine. Osseous demineralization. No acute cervical spine abnormalities.  Original Report Authenticated By: Lollie Marrow, M.D.   Ct Pelvis Wo Contrast  07/06/2011  *RADIOLOGY REPORT*  Clinical Data:  Groin pain.  Fall.  Pelvic fracture.  CT PELVIS WITHOUT CONTRAST  Technique:  Multidetector CT imaging of the pelvis was performed following the standard protocol without intravenous contrast.  Comparison:  Plain films 07/06/2011.  Findings:  The obturator rings are intact.  Calcific tendonitis of both gluteal insertions at the greater trochanter.  Common hamstring origin  calcific tendonitis is also present.  Bilateral SI joint degenerative disease.  Sacrum appears intact.  The lower lumbar spondylosis and facet arthrosis.  No acute abnormality in the visceral pelvis.  Hysterectomy.  Atherosclerosis.  There is no hip fracture.  Mild bilateral hip osteoarthritis. Abnormality on prior radiograph represents calcific tendonitis, which is greater on the left than right.  There is a small distal tendinous bone fragment that accounted for the abnormality on radiograph.  Muscular compartments grossly appear within normal limits.  Sagittal images demonstrate degenerative grade 1 anterolisthesis of L4 on L5.  Broad-based calcified L3-L4 disc protrusion.  IMPRESSION: No acute osseous abnormality.  Calcific  tendonitis accounts for fragment seen on prior radiograph.  No pelvic or hip fracture.  Original Report Authenticated By: Andreas Newport, M.D.   Dg Shoulder Left  07/05/2011  *RADIOLOGY REPORT*  Clinical Data: Fall, left shoulder pain.  LEFT SHOULDER - 2+ VIEW  Comparison: 07/02/2011  Findings: There is a left humeral neck fracture.  Small lateral fragment is slightly displaced.  No subluxation or dislocation. Advanced degenerative changes in the left glenohumeral and AC joints.  IMPRESSION: Left humeral neck fracture.  Original Report Authenticated By: Cyndie Chime, M.D.   Dg Shoulder Left  07/02/2011  *RADIOLOGY REPORT*  Clinical Data: Larey Seat today with pain  LEFT SHOULDER - 2+ VIEW  Comparison: None.  Findings: There  is deformity of the left humeral neck which appears to represent an old fracture with healing.  Clinical correlation is recommended.  The left humeral head is normally positioned and there is degenerative change involving the left AC joint.  IMPRESSION: Apparent old healed fracture of the left humeral neck.  No definite acute abnormality.  Original Report Authenticated By: Juline Patch, M.D.   Full Code   Hospital Course: See H&P for complete admission details.  Ms. Fennell is a pleasantly demented 77 year old white female who had been to the emergency room several times in the past few weeks. She was having crying spells. Her Xanax was increased. She had several falls and had a resulting humerus fracture prior to admission. She has a left arm cast and sling in place. She was from assisted living facility and it was felt that she might need a higher level of care. Upon further questioning, her family reports that she had been hallucinating. For the first 24 hours of admission, patient was crying continuously and yelling out. She was confused. She was unable to eat. She was unable to follow commands or even speak much or answer questions. Clearly, she was delirious rather than only in pain. It became apparent that her confusion had worsened with the Percocet that was started for her fractures prior to admission. The Xanax dose was decreased, and her Percocet was stopped. She was placed on Tylenol alone and has actually had a remarkable improvement in her mental status. She has also been in very little pain. She is eating. She is following commands. She is interactive and back to her baseline. She remains deconditioned, however, and would benefit from short-term skilled nursing facility placement for strengthening. She remains a fall risk. She had been seeing an orthopedist in needed. She may followup with that physician regarding her fractures. She was dehydrated on admission, and her Lasix has been held. Should she have any problems with significant swelling, this can be started back. Her potassium was low and is being aggressively repleted. She is medically stable for discharge once a bed is available. If she is still here tomorrow, 07/10/2011, a repeat potassium level will be checked then. If she leaves before tomorrow, she will need a repeat potassium drawn at skilled nursing facility within a few days. I recommend that any sedating medications her opiate analgesics be  used with extreme caution.  Discharge Exam:  Blood pressure 124/66, pulse 72, temperature 97.5 F (36.4 C), temperature source Oral, resp. rate 24, height 5\' 4"  (1.626 m), weight 67.6 kg (149 lb 0.5 oz), SpO2 100.00%.  Unchanged from 07/08/2011  Signed: Crista Curb L 07/09/2011, 12:33 PM

## 2011-07-09 NOTE — Progress Notes (Signed)
Occupational Therapy Evaluation Patient Details Name: Michelle Sweeney MRN: [id number] DOB: 10/27/1932 Today's Date: 07/09/2011 OT Eval [phone] Problem List:  Patient Active Problem List  Diagnoses  . Fall  . Physical deconditioning  . Humerus fracture  . Alzheimer disease  . Anxiety disorder  . Glaucoma  . DM type 2 (diabetes mellitus, type 2)  . Delirium, in, senile dementia    Past Medical History:  Past Medical History  Diagnosis Date  . IBS (irritable bowel syndrome)   . HTN (hypertension)   . Osteoporosis   . Hyperlipidemia   . Hyperkalemia   . Diabetes mellitus   . Alzheimer disease   . Arthritis    Past Surgical History:  Past Surgical History  Procedure Date  . Vascular surgery   . Abdominal hysterectomy     OT Assessment/Plan/Recommendation OT Assessment Clinical Impression Statement: A:  Patient with decreased I with BADLs due to decreased ability to ambulate and decreased use of LUE due to recent fracture. OT Recommendation/Assessment: Patient will need skilled OT in the acute care venue OT Problem List: Decreased activity tolerance;Decreased safety awareness;Impaired UE functional use OT Therapy Diagnosis : Generalized weakness OT Plan OT Frequency: Min 2X/week OT Treatment/Interventions: Self-care/ADL training;Therapeutic exercise;Therapeutic activities OT Recommendation Follow Up Recommendations: Skilled nursing facility Equipment Recommended: Defer to next venue Individuals Consulted Consulted and Agree with Results and Recommendations: Patient OT Goals Acute Rehab OT Goals OT Goal Formulation: With patient Time For Goal Achievement: 2 weeks Miscellaneous OT Goals Miscellaneous OT Goal #1: Patient will complete LE dressing with mod pa. Miscellaneous OT Goal #2: Patient will increase ability to complete functional transfers to min-mod pa. Miscellaneous OT Goal #3: Patient will be educated on an UB HEP.  OT Evaluation Precautions/Restrictions    Precautions Precautions: Fall Required Braces or Orthoses: No Restrictions Weight Bearing Restrictions: Yes LUE Weight Bearing: Non weight bearing Other Position/Activity Restrictions: Left proximal humerus fracture with arm in cast and sling in place. Prior Functioning Home Living Receives Help From: Personal care attendant Type of Home: Assisted living Home Layout: One level Home Access: Level entry Prior Function Vocation: Retired ADL ADL Eating/Feeding: Set up Grooming: Performed;Wash/dry face;Wash/dry hands;Brushing hair;Set up Grooming Details (indicate cue type and reason): after setup, patient able to wash her hands, face, and comb her hair, seated in chair. Where Assessed - Grooming: Sitting, chair Lower Body Dressing: Simulated;Maximal assistance Lower Body Dressing Details (indicate cue type and reason): requested that she doff slipper socks, unable to do so, will require max pa with le dressing. Where Assessed - Lower Body Dressing: Sitting, chair Toileting - Hygiene: Performed;Minimal assistance Toileting - Hygiene Details (indicate cue type and reason): stood at bedside commode with min pa for balance while completing hygiene.  Required min pa from PTA to fully clean peri area. Where Assessed - Toileting Hygiene: Sit to stand from 3-in-1 or toilet;Standing Vision/Perception  Vision - History Baseline Vision: Wears glasses all the time Cognition Cognition Arousal/Alertness: Awake/alert Overall Cognitive Status: History of cognitive impairments History of Cognitive Impairment: Appears at baseline functioning Orientation Level: Oriented X4;Oriented to person;Oriented to time;Oriented to situation Sensation/Coordination Sensation Light Touch: Appears Intact Stereognosis: Not tested Hot/Cold: Not tested Proprioception: Not tested Coordination Gross Motor Movements are Fluid and Coordinated: Yes Fine Motor Movements are Fluid and Coordinated: Yes Extremity  Assessment RUE Assessment RUE Assessment: Within Functional Limits LUE Assessment LUE Assessment:  (LUE in cast, is able to move digits [facility] Mobility    Exercises   End of Session OT -  End of Session Activity Tolerance: Patient tolerated treatment well Patient left: in chair;with call bell in reach;Other (comment) (chair alarm in place, and nurse in room) General Behavior During Session: [facility] for tasks performed Cognition: [facility] for tasks performed   Shirlean Mylar, OTR/L  07/09/2011, 10:19 AM

## 2011-07-09 NOTE — Progress Notes (Addendum)
1535: Gave report to [place] @ [facility]. Px alert and oriented x 3. Not always sure she knows where she is. Px is pleasant and cooperative.  Removed foley cath and RFA IV. Within last 10 minutes of report. No hematoma present. Oral and peri care performed. Moved px to [facility] via wheelchair. Katherina Mires [place]  1636: Px daughter here to walk with Nurse over to [facility].

## 2011-07-10 LAB — GLUCOSE, CAPILLARY
Glucose-Capillary: 153 mg/dL — ABNORMAL HIGH (ref 70–99)
Glucose-Capillary: 148 mg/dL — ABNORMAL HIGH (ref 70–99)
Glucose-Capillary: 165 mg/dL — ABNORMAL HIGH (ref 70–99)

## 2011-07-11 LAB — GLUCOSE, CAPILLARY
Glucose-Capillary: 145 mg/dL — ABNORMAL HIGH (ref 70–99)
Glucose-Capillary: 181 mg/dL — ABNORMAL HIGH (ref 70–99)
Glucose-Capillary: 218 mg/dL — ABNORMAL HIGH (ref 70–99)

## 2011-07-12 LAB — URINE CULTURE

## 2011-07-13 LAB — GLUCOSE, CAPILLARY
Glucose-Capillary: 154 mg/dL — ABNORMAL HIGH (ref 70–99)
Glucose-Capillary: 187 mg/dL — ABNORMAL HIGH (ref 70–99)
Glucose-Capillary: 161 mg/dL — ABNORMAL HIGH (ref 70–99)

## 2011-07-14 LAB — GLUCOSE, CAPILLARY: Glucose-Capillary: 183 mg/dL — ABNORMAL HIGH (ref 70–99)

## 2011-07-15 LAB — GLUCOSE, CAPILLARY
Glucose-Capillary: 340 mg/dL — ABNORMAL HIGH (ref 70–99)
Glucose-Capillary: 143 mg/dL — ABNORMAL HIGH (ref 70–99)

## 2011-07-16 LAB — GLUCOSE, CAPILLARY

## 2011-07-18 LAB — GLUCOSE, CAPILLARY

## 2011-07-19 LAB — GLUCOSE, CAPILLARY
Glucose-Capillary: 175 mg/dL — ABNORMAL HIGH (ref 70–99)
Glucose-Capillary: 133 mg/dL — ABNORMAL HIGH (ref 70–99)
Glucose-Capillary: 170 mg/dL — ABNORMAL HIGH (ref 70–99)

## 2011-07-20 ENCOUNTER — Inpatient Hospital Stay (HOSPITAL_COMMUNITY)
Admission: EM | Admit: 2011-07-20 | Discharge: 2011-07-27 | DRG: 469 | Disposition: A | Discharge status: Skilled Nursing Facility | Payer: Medicare Other | Attending: Internal Medicine | Admitting: Internal Medicine

## 2011-07-20 ENCOUNTER — Encounter (HOSPITAL_COMMUNITY): Payer: Self-pay | Admitting: *Deleted

## 2011-07-20 ENCOUNTER — Emergency Department (HOSPITAL_COMMUNITY): Payer: Medicare Other

## 2011-07-20 ENCOUNTER — Other Ambulatory Visit: Payer: Self-pay

## 2011-07-20 DIAGNOSIS — G9340 Encephalopathy, unspecified: Secondary | ICD-10-CM | POA: Diagnosis present

## 2011-07-20 DIAGNOSIS — W19XXXA Unspecified fall, initial encounter: Secondary | ICD-10-CM | POA: Diagnosis present

## 2011-07-20 DIAGNOSIS — R5381 Other malaise: Secondary | ICD-10-CM | POA: Diagnosis present

## 2011-07-20 DIAGNOSIS — S72002A Fracture of unspecified part of neck of left femur, initial encounter for closed fracture: Secondary | ICD-10-CM | POA: Diagnosis present

## 2011-07-20 DIAGNOSIS — S72033A Displaced midcervical fracture of unspecified femur, initial encounter for closed fracture: Principal | ICD-10-CM | POA: Diagnosis present

## 2011-07-20 DIAGNOSIS — F0280 Dementia in other diseases classified elsewhere without behavioral disturbance: Secondary | ICD-10-CM | POA: Diagnosis present

## 2011-07-20 DIAGNOSIS — D62 Acute posthemorrhagic anemia: Secondary | ICD-10-CM | POA: Diagnosis not present

## 2011-07-20 DIAGNOSIS — G309 Alzheimer's disease, unspecified: Secondary | ICD-10-CM | POA: Diagnosis present

## 2011-07-20 DIAGNOSIS — N390 Urinary tract infection, site not specified: Secondary | ICD-10-CM | POA: Diagnosis not present

## 2011-07-20 DIAGNOSIS — Z66 Do not resuscitate: Secondary | ICD-10-CM | POA: Diagnosis present

## 2011-07-20 DIAGNOSIS — E876 Hypokalemia: Secondary | ICD-10-CM | POA: Diagnosis not present

## 2011-07-20 DIAGNOSIS — E119 Type 2 diabetes mellitus without complications: Secondary | ICD-10-CM | POA: Diagnosis present

## 2011-07-20 DIAGNOSIS — G9349 Other encephalopathy: Secondary | ICD-10-CM | POA: Diagnosis not present

## 2011-07-20 LAB — COMPREHENSIVE METABOLIC PANEL
Alkaline Phosphatase: 192 U/L — ABNORMAL HIGH (ref 39–117)
BUN: 11 mg/dL (ref 6–23)
CO2: 23 mEq/L (ref 19–32)
Chloride: 100 mEq/L (ref 96–112)
GFR calc Af Amer: >90 mL/min (ref >90)
Glucose, Bld: 201 mg/dL — ABNORMAL HIGH (ref 70–99)
Potassium: 3.5 mEq/L (ref 3.5–5.1)
Total Bilirubin: 0.6 mg/dL (ref 0.3–1.2)

## 2011-07-20 LAB — URINALYSIS, ROUTINE W REFLEX MICROSCOPIC
Bilirubin Urine: NEGATIVE
Ketones, ur: 15 mg/dL — AB
Leukocytes, UA: NEGATIVE
Nitrite: POSITIVE — AB
Protein, ur: NEGATIVE mg/dL

## 2011-07-20 LAB — URINE MICROSCOPIC-ADD ON

## 2011-07-20 LAB — CBC
HCT: 46.7 % — ABNORMAL HIGH (ref 36.0–46.0)
Hemoglobin: 15.4 g/dL — ABNORMAL HIGH (ref 12.0–15.0)
RBC: 4.97 MIL/uL (ref 3.87–5.11)
WBC: 4.7 10*3/uL (ref 4.0–10.5)

## 2011-07-20 MED ORDER — SODIUM CHLORIDE 0.9 % IV SOLN: INTRAVENOUS | Status: DC

## 2011-07-20 MED ORDER — MORPHINE SULFATE 2 MG/ML IJ SOLN
1.0000 mg | Freq: Once | INTRAMUSCULAR | Status: AC
  Administered 2011-07-20: 1 mg via INTRAVENOUS
  Filled 2011-07-20: qty 1

## 2011-07-20 MED ORDER — ONDANSETRON HCL 4 MG/2ML IJ SOLN
4.0000 mg | Freq: Once | INTRAMUSCULAR | Status: AC
  Administered 2011-07-20: 4 mg via INTRAVENOUS
  Filled 2011-07-20: qty 2

## 2011-07-20 MED ORDER — SODIUM CHLORIDE 0.9 % IV SOLN
INTRAVENOUS | Status: DC
  Administered 2011-07-20: 20:00:00 via INTRAVENOUS

## 2011-07-20 NOTE — ED Notes (Signed)
Pt is a resident at [facility]. Pt fell on Lt Hip today approx 1 hr ago.

## 2011-07-20 NOTE — H&P (Addendum)
PCP:   Donzetta Sprung, MD   Chief Complaint:  Fall  HPI: 78 year old female with moderate numbers dementia who is at the nursing home and got up from her wheelchair by herself and fail and broke her left hip. Patient has dementia she is verbal but cannot answer questions appropriately. Her daughter and her son are with her right now. She also had her fall several weeks ago where she broke her left upper chin which is now casted. She's currently in a rehabilitation center getting physical therapy to minimize her falls. She did not pass out today. There is no report of any fevers or any recent illnesses. She currently is laying in the emergency room at bed and looks comfortable and denies any pain.  Review of Systems:  Otherwise negative   Past Medical History: Past Medical History  Diagnosis Date  . IBS (irritable bowel syndrome)   . HTN (hypertension)   . Osteoporosis   . Hyperlipidemia   . Hyperkalemia   . Diabetes mellitus   . Alzheimer disease   . Arthritis    Past Surgical History  Procedure Date  . Vascular surgery   . Abdominal hysterectomy     Medications: Prior to Admission medications   Medication Sig Start Date End Date Taking? Authorizing Provider  acetaminophen (TYLENOL) 325 MG tablet Take 2 tablets (650 mg total) by mouth 3 (three) times daily. 07/09/11 08/19/11 Yes Corinna L Lendell Caprice  ALPRAZolam (XANAX) 0.25 MG tablet Take 0.25 mg by mouth 2 (two) times daily.   07/09/11 08/08/11 Yes Corinna L Sullivan  aspirin EC 81 MG tablet Take 81 mg by mouth every morning.    Yes Historical Provider, MD  bimatoprost (LUMIGAN) 0.03 % ophthalmic solution Place 1 drop into both eyes at bedtime.     Yes Historical Provider, MD  Calcium Carbonate-Vitamin D (CALCIUM 600 + D PO) Take 1 tablet by mouth 2 (two) times daily.     Yes Historical Provider, MD  cholecalciferol (VITAMIN D) 1000 UNITS tablet Take 1,000 Units by mouth daily.     Yes Historical Provider, MD  citalopram (CELEXA)  20 MG tablet Take 20 mg by mouth daily.     Yes Historical Provider, MD  haloperidol (HALDOL) 1 MG tablet Take 1 mg by mouth 2 (two) times daily as needed. For severe agitation    Yes Historical Provider, MD  Lactobacillus (FLORAJEN ACIDOPHILUS) CAPS Take 1 capsule by mouth every morning.    Yes Historical Provider, MD  lovastatin (MEVACOR) 20 MG tablet Take 20 mg by mouth at bedtime.     Yes Historical Provider, MD  Polyethyl Glycol-Propyl Glycol (SYSTANE) 0.4-0.3 % SOLN Place 1 drop into the right eye 3 (three) times daily. UNTIL GONE   Yes Historical Provider, MD  potassium chloride (KLOR-CON) 20 MEQ packet Take 20 mEq by mouth 2 (two) times daily.    Yes Historical Provider, MD  rivastigmine (EXELON) 9.5 mg/24hr Place 1 patch onto the skin daily. Remove used patches before applying a new patch   Yes Historical Provider, MD  albuterol (PROVENTIL HFA;VENTOLIN HFA) 108 (90 BASE) MCG/ACT inhaler Inhale 2 puffs into the lungs every 6 (six) hours as needed. wheezing     Historical Provider, MD  loperamide (IMODIUM) 2 MG capsule Take 2 capsules (4 mg total) by mouth 3 (three) times daily as needed for diarrhea or loose stools. Loose stool 07/09/11   Corinna L Lendell Caprice    Allergies:   Allergies  Allergen Reactions  . Penicillins   .  Quinine Derivatives     Social History:  reports that she has never smoked. She does not have any smokeless tobacco history on file. She reports that she does not drink alcohol or use illicit drugs.   Family History: History reviewed. No pertinent family history.  Physical Exam: Filed Vitals:   07/20/11 1801  BP: 168/90  Pulse: 88  Resp: 18  Height: 5\' 4"  (1.626 m)  Weight: 65.772 kg (145 lb)  SpO2: 98%   BP 134/65  Pulse 113  Resp 18  Ht 5\' 4"  (1.626 m)  Wt 65.772 kg (145 lb)  BMI 24.89 kg/m2  SpO2 94% BP 134/65  Pulse 113  Resp 18  Ht 5\' 4"  (1.626 m)  Wt 65.772 kg (145 lb)  BMI 24.89 kg/m2  SpO2 94% General appearance: alert, cooperative  and no distress Lungs: clear to auscultation bilaterally Heart: regular rate and rhythm, S1, S2 normal, no murmur, click, rub or gallop Abdomen: soft, non-tender; bowel sounds normal; no masses,  no organomegaly Extremities: extremities normal, atraumatic, no cyanosis or edema Pulses: 2+ and symmetric Skin: Skin color, texture, turgor normal. No rashes or lesions     Labs on Admission:   [facility] 07/20/11 1945  NA 137  K 3.5  CL 100  CO2 23  GLUCOSE 201*  BUN 11  CREATININE 0.48*  CALCIUM 9.9  MG --  PHOS --    Basename 07/20/11 1945  AST 23  ALT 20  ALKPHOS 192*  BILITOT 0.6  PROT 7.1  ALBUMIN 3.2*    Basename 07/20/11 1945  WBC 4.7  NEUTROABS --  HGB 15.4*  HCT 46.7*  MCV 94.0  PLT 327    Radiological Exams on Admission: Dg Chest 1 View  07/20/2011  *RADIOLOGY REPORT*  Clinical Data: Fall, question hip fracture  CHEST - 1 VIEW  Comparison: Chest radiograph 06/20/2011  Findings: Stable cardiac silhouette.  No effusion, infiltrate, pneumothorax. There is a fracture of the left humeral head which is an impaction type fracture.  Difficult to tell the chronicity of this fracture.  IMPRESSION:  Fracture of the left proximal humerus.  Original Report Authenticated By: Genevive Bi, M.D.   Dg Chest 2 View  06/30/2011  *RADIOLOGY REPORT*  Clinical Data: Trauma secondary to a fall today.  CHEST - 2 VIEW  Comparison: 10/28/2010  Findings: Heart size and vascularity are normal and the lungs are clear.  Old mild compression fractures of T11 and T12.  No acute osseous abnormality.  IMPRESSION: No acute disease in the chest.  Original Report Authenticated By: Gwynn Burly, M.D.   Dg Pelvis 1-2 Views  07/06/2011  *RADIOLOGY REPORT*  Clinical Data: Fall.  Pelvic pain.  PELVIS - 1-2 VIEW  Comparison: None.  Findings: Pelvic rings grossly appear intact.  Hips appear located. The hips are externally rotated at the time of imaging.  No femoral neck fracture is identified.   Faint lucency is present in the right greater trochanter which could represent a small trochanteric avulsion fracture.  CT may be useful for further evaluation if the patient has clinical symptoms in this region.  IMPRESSION: No displaced femoral neck fracture identified.  Possible avulsion of the right greater trochanter.  Original Report Authenticated By: Andreas Newport, M.D.   Dg Elbow Complete Left  07/02/2011  *RADIOLOGY REPORT*  Clinical Data: Recent fall, pain  LEFT ELBOW - COMPLETE 3+ VIEW  Comparison: None.  Findings: On the lateral view, there is displacement of the anterior fat pad consistent with joint  space effusion.  On two views there is irregularity of the cortical margin of the distal left humerus above the lateral epicondyle consistent with distal humeral fracture.  No malalignment is seen.  IMPRESSION: Left elbow joint effusion with apparent nondisplaced fracture of the distal left humerus.  Original Report Authenticated By: Juline Patch, M.D.   Dg Wrist Complete Left  07/02/2011  *RADIOLOGY REPORT*  Clinical Data: Larey Seat today with pain  LEFT WRIST - COMPLETE 3+ VIEW  Comparison: None.  Findings: There is some deformity of the distal left radius and ulna probably due to prior trauma with healing.  There is also an ulnar positive variance which can result in ulnar impaction syndrome and instability.  There is soft tissue swelling over the ulnar aspect of the wrist but no acute fracture is seen.  Alignment is normal.  IMPRESSION:  1.  No acute fracture.  Ulnar positive variance is related with ulnar impaction syndrome and instability. 2.  Probable old fracture deformity of the distal radius and ulna.  Original Report Authenticated By: Juline Patch, M.D.   Dg Hip Complete Left  07/20/2011  *RADIOLOGY REPORT*  Clinical Data: Left hip pain  LEFT HIP - COMPLETE 2+ VIEW  Comparison: Plain films 07/06/2011 the  Findings: There is a sub capital fracture of the left femoral neck with varus  angulation.  Hip is located.  IMPRESSION: Subcapital left femoral neck fracture.  Original Report Authenticated By: Genevive Bi, M.D.   Ct Head Wo Contrast  07/20/2011  *RADIOLOGY REPORT*  Clinical Data:  Hip pain, fall, the  CT HEAD WITHOUT CONTRAST CT CERVICAL SPINE WITHOUT CONTRAST  Technique:  Multidetector CT imaging of the head and cervical spine was performed following the standard protocol without intravenous contrast.  Multiplanar CT image reconstructions of the cervical spine were also generated.  Comparison:  Head CT 06/30/2011  CT HEAD  Findings: There is a small scalp hematoma over the lateral left frontal bone.  There is no evidence of intracranial hemorrhage.  No focal mass lesion.  No CT evidence of acute infarction.  There is generalized cortical atrophy.  Periventricular white matter hypodensities are present.  There is no evidence skull fracture.  No fluid the paranasal sinuses or mastoid air cells.  IMPRESSION:  1.  Small left scalp hematoma. 2.  No evidence of intracranial trauma. 3.  Atrophy and microvascular disease.  CT CERVICAL SPINE  Findings: There is no prevertebral soft tissue swelling.  Normal alignment cervical pain bodies.  There is endplate osteophytosis and loss of disc space in the lower cervical spine.  Normal cervical facet articulation.  Normal craniocervical junction.  No evidence epidural paraspinal hematoma.  IMPRESSION:  1.  No evidence of cervical spine fracture. 2.  Multilevel spondylosis.  Original Report Authenticated By: Genevive Bi, M.D.   Ct Head Wo Contrast  06/30/2011  *RADIOLOGY REPORT*  Clinical Data:  Fall, abrasion right cheek, hypertension, diabetes, patient does not remember where happened  CT HEAD WITHOUT CONTRAST CT CERVICAL SPINE WITHOUT CONTRAST  Technique:  Multidetector CT imaging of the head and cervical spine was performed following the standard protocol without intravenous contrast.  Multiplanar CT image reconstructions of the cervical  spine were also generated.  Comparison:  11/09/2009  CT HEAD  Findings: Generalized atrophy. Normal ventricular morphology. No midline shift or mass effect. Small vessel chronic ischemic changes of deep cerebral white matter. No intracranial hemorrhage, mass lesion, or acute infarction. Visualized paranasal sinuses and mastoid air cells clear. Bones demineralized. Atherosclerotic  calcification of internal carotid and vertebral arteries at skull base.  IMPRESSION: Atrophy with small vessel chronic ischemic changes of deep cerebral white matter. No acute intracranial abnormalities.  CT CERVICAL SPINE  Findings: Atherosclerotic calcifications within the carotid arterial systems in the neck bilaterally. Visualized skull base intact. Diffuse osseous demineralization. Multilevel disc space narrowing endplate spur formation. Diffuse facet degenerative changes throughout the cervical spine. Prevertebral soft tissues normal thickness. Vertebral body heights maintained without fracture or subluxation. Lung apices clear. No acute bony abnormalities identified.  IMPRESSION: Multilevel degenerative disc and facet disease changes of the cervical spine. Osseous demineralization. No acute cervical spine abnormalities.  Original Report Authenticated By: Lollie Marrow, M.D.   Ct Cervical Spine Wo Contrast  07/20/2011  *RADIOLOGY REPORT*  Clinical Data:  Hip pain, fall, the  CT HEAD WITHOUT CONTRAST CT CERVICAL SPINE WITHOUT CONTRAST  Technique:  Multidetector CT imaging of the head and cervical spine was performed following the standard protocol without intravenous contrast.  Multiplanar CT image reconstructions of the cervical spine were also generated.  Comparison:  Head CT 06/30/2011  CT HEAD  Findings: There is a small scalp hematoma over the lateral left frontal bone.  There is no evidence of intracranial hemorrhage.  No focal mass lesion.  No CT evidence of acute infarction.  There is generalized cortical atrophy.   Periventricular white matter hypodensities are present.  There is no evidence skull fracture.  No fluid the paranasal sinuses or mastoid air cells.  IMPRESSION:  1.  Small left scalp hematoma. 2.  No evidence of intracranial trauma. 3.  Atrophy and microvascular disease.  CT CERVICAL SPINE  Findings: There is no prevertebral soft tissue swelling.  Normal alignment cervical pain bodies.  There is endplate osteophytosis and loss of disc space in the lower cervical spine.  Normal cervical facet articulation.  Normal craniocervical junction.  No evidence epidural paraspinal hematoma.  IMPRESSION:  1.  No evidence of cervical spine fracture. 2.  Multilevel spondylosis.  Original Report Authenticated By: Genevive Bi, M.D.   Ct Cervical Spine Wo Contrast  06/30/2011  *RADIOLOGY REPORT*  Clinical Data:  Fall, abrasion right cheek, hypertension, diabetes, patient does not remember where happened  CT HEAD WITHOUT CONTRAST CT CERVICAL SPINE WITHOUT CONTRAST  Technique:  Multidetector CT imaging of the head and cervical spine was performed following the standard protocol without intravenous contrast.  Multiplanar CT image reconstructions of the cervical spine were also generated.  Comparison:  11/09/2009  CT HEAD  Findings: Generalized atrophy. Normal ventricular morphology. No midline shift or mass effect. Small vessel chronic ischemic changes of deep cerebral white matter. No intracranial hemorrhage, mass lesion, or acute infarction. Visualized paranasal sinuses and mastoid air cells clear. Bones demineralized. Atherosclerotic calcification of internal carotid and vertebral arteries at skull base.  IMPRESSION: Atrophy with small vessel chronic ischemic changes of deep cerebral white matter. No acute intracranial abnormalities.  CT CERVICAL SPINE  Findings: Atherosclerotic calcifications within the carotid arterial systems in the neck bilaterally. Visualized skull base intact. Diffuse osseous demineralization.  Multilevel disc space narrowing endplate spur formation. Diffuse facet degenerative changes throughout the cervical spine. Prevertebral soft tissues normal thickness. Vertebral body heights maintained without fracture or subluxation. Lung apices clear. No acute bony abnormalities identified.  IMPRESSION: Multilevel degenerative disc and facet disease changes of the cervical spine. Osseous demineralization. No acute cervical spine abnormalities.  Original Report Authenticated By: Lollie Marrow, M.D.   Ct Pelvis Wo Contrast  07/06/2011  *RADIOLOGY REPORT*  Clinical Data:  Groin pain.  Fall.  Pelvic fracture.  CT PELVIS WITHOUT CONTRAST  Technique:  Multidetector CT imaging of the pelvis was performed following the standard protocol without intravenous contrast.  Comparison:  Plain films 07/06/2011.  Findings:  The obturator rings are intact.  Calcific tendonitis of both gluteal insertions at the greater trochanter.  Common hamstring origin calcific tendonitis is also present.  Bilateral SI joint degenerative disease.  Sacrum appears intact.  The lower lumbar spondylosis and facet arthrosis.  No acute abnormality in the visceral pelvis.  Hysterectomy.  Atherosclerosis.  There is no hip fracture.  Mild bilateral hip osteoarthritis. Abnormality on prior radiograph represents calcific tendonitis, which is greater on the left than right.  There is a small distal tendinous bone fragment that accounted for the abnormality on radiograph.  Muscular compartments grossly appear within normal limits.  Sagittal images demonstrate degenerative grade 1 anterolisthesis of L4 on L5.  Broad-based calcified L3-L4 disc protrusion.  IMPRESSION: No acute osseous abnormality.  Calcific  tendonitis accounts for fragment seen on prior radiograph.  No pelvic or hip fracture.  Original Report Authenticated By: Andreas Newport, M.D.   Dg Shoulder Left  07/05/2011  *RADIOLOGY REPORT*  Clinical Data: Fall, left shoulder pain.  LEFT SHOULDER  - 2+ VIEW  Comparison: 07/02/2011  Findings: There is a left humeral neck fracture.  Small lateral fragment is slightly displaced.  No subluxation or dislocation. Advanced degenerative changes in the left glenohumeral and AC joints.  IMPRESSION: Left humeral neck fracture.  Original Report Authenticated By: Cyndie Chime, M.D.   Dg Shoulder Left  07/02/2011  *RADIOLOGY REPORT*  Clinical Data: Larey Seat today with pain  LEFT SHOULDER - 2+ VIEW  Comparison: None.  Findings: There is deformity of the left humeral neck which appears to represent an old fracture with healing.  Clinical correlation is recommended.  The left humeral head is normally positioned and there is degenerative change involving the left AC joint.  IMPRESSION: Apparent old healed fracture of the left humeral neck.  No definite acute abnormality.  Original Report Authenticated By: Juline Patch, M.D.    Assessment/Plan Present on Admission:  78 year old female with moderate to advanced dementia with mechanical fall and resultant left hip fracture  .Hip fracture, left n.p.o. after midnight orthopedic surgery will see in the morning hopefully will go to the OR later in the morning. Provide morphine as needed.  .Fall mechanical  .Alzheimer disease stable  .Physical deconditioning  Patient is full code. She is moderate risk for surgery due to her advanced age and multiple medical problems. She has no known coronary artery disease. Preliminary EKG negative. Proceed with surgery to fix her hip to optimize recovery later on this morning.   Storey Stangeland A [phone] 07/20/2011, 10:23 PM Patient seen before midnight

## 2011-07-20 NOTE — ED Provider Notes (Signed)
History     CSN: [id number]  Arrival date & time 07/20/11  1800   First MD Initiated Contact with Patient 07/20/11 1844      Chief Complaint  Patient presents with  . Hip Pain  . Fall    (Consider location/radiation/quality/duration/timing/severity/associated sxs/prior treatment) Patient is a 78 y.o. female presenting with fall. The history is provided by the nursing home, the EMS personnel and a relative.  Fall The accident occurred 1 to 2 hours ago. The fall occurred while walking. There was no blood loss. The pain is present in the left hip. The pain is at a severity of 2/10. The pain is mild. She was not ambulatory at the scene. Pertinent negatives include no abdominal pain, no nausea, no vomiting, no headaches and no loss of consciousness. The symptoms are aggravated by use of the injured limb.   Resident of [facility]. Fell and pain to left hip about one hour ago. Hx of Alzheimers and Level 5 Caveat applies to specific hx details and ROS. Larey Seat 12/24 and fractured left proximal humerous. Hx of frequent falls recently. Left arm in bivalved cast.    Past Medical History  Diagnosis Date  . IBS (irritable bowel syndrome)   . HTN (hypertension)   . Osteoporosis   . Hyperlipidemia   . Hyperkalemia   . Diabetes mellitus   . Alzheimer disease   . Arthritis     Past Surgical History  Procedure Date  . Vascular surgery   . Abdominal hysterectomy     History reviewed. No pertinent family history.  History  Substance Use Topics  . Smoking status: Never Smoker   . Smokeless tobacco: Not on file  . Alcohol Use: No    OB History    Grav Para Term Preterm Abortions TAB SAB Ect Mult Living                  Review of Systems  Unable to perform ROS Gastrointestinal: Negative for nausea, vomiting and abdominal pain.  Neurological: Negative for loss of consciousness and headaches.  Level 5 due to sig Alzheimers.   Allergies  Penicillins and Quinine derivatives  Home  Medications   Current Outpatient Rx  Name Route Sig Dispense Refill  . ACETAMINOPHEN 325 MG PO TABS Oral Take 2 tablets (650 mg total) by mouth 3 (three) times daily. 30 tablet   . ALPRAZOLAM 0.25 MG PO TABS Oral Take 0.25 mg by mouth 2 (two) times daily.      . ASPIRIN EC 81 MG PO TBEC Oral Take 81 mg by mouth every morning.     Marland Kitchen BIMATOPROST 0.03 % OP SOLN Both Eyes Place 1 drop into both eyes at bedtime.      Marland Kitchen CALCIUM 600 + D PO Oral Take 1 tablet by mouth 2 (two) times daily.      Marland Kitchen VITAMIN D 1000 UNITS PO TABS Oral Take 1,000 Units by mouth daily.      Marland Kitchen CITALOPRAM HYDROBROMIDE 20 MG PO TABS Oral Take 20 mg by mouth daily.      Marland Kitchen HALOPERIDOL 1 MG PO TABS Oral Take 1 mg by mouth 2 (two) times daily as needed. For severe agitation     . FLORAJEN ACIDOPHILUS PO CAPS Oral Take 1 capsule by mouth every morning.     Marland Kitchen LOVASTATIN 20 MG PO TABS Oral Take 20 mg by mouth at bedtime.      Marland Kitchen POLYETHYL GLYCOL-PROPYL GLYCOL 0.4-0.3 % OP SOLN Right Eye Place  1 drop into the right eye 3 (three) times daily. UNTIL GONE    . POTASSIUM CHLORIDE 20 MEQ PO PACK Oral Take 20 mEq by mouth 2 (two) times daily.     Marland Kitchen RIVASTIGMINE 9.5 MG/24HR TD PT24 Transdermal Place 1 patch onto the skin daily. Remove used patches before applying a new patch    . ALBUTEROL SULFATE HFA 108 (90 BASE) MCG/ACT IN AERS Inhalation Inhale 2 puffs into the lungs every 6 (six) hours as needed. wheezing     . LOPERAMIDE HCL 2 MG PO CAPS Oral Take 2 capsules (4 mg total) by mouth 3 (three) times daily as needed for diarrhea or loose stools. Loose stool 30 capsule     BP 168/90  Pulse 88  Resp 18  Ht 5\' 4"  (1.626 m)  Wt 145 lb (65.772 kg)  BMI 24.89 kg/m2  SpO2 98%  Physical Exam  Nursing note and vitals reviewed. Constitutional: She appears well-developed and well-nourished. No distress.  HENT:  Head: Normocephalic and atraumatic.  Mouth/Throat: Oropharynx is clear and moist.  Eyes: Conjunctivae are normal. Pupils are equal,  round, and reactive to light.  Neck: Normal range of motion. Neck supple.  Cardiovascular: Normal rate, regular rhythm, normal heart sounds and intact distal pulses.   Pulmonary/Chest: Effort normal and breath sounds normal.  Abdominal: Soft. Bowel sounds are normal. There is no tenderness.  Musculoskeletal: She exhibits tenderness. She exhibits no edema.       Left arm in cast bivalved. Left hip tenderness no obvious deformity but leg externally rotated. Good distal cap refill.   Neurological: No cranial nerve deficit. She exhibits normal muscle tone. Coordination normal.  Skin: Skin is warm. No rash noted.    ED Course  Procedures (including critical care time)   Date: 07/20/2011  Rate: 110  Rhythm: sinus tachycardia  QRS Axis: normal  Intervals: normal  ST/T Wave abnormalities: nonspecific ST/T changes  Conduction Disutrbances:left anterior fascicular block  Narrative Interpretation:   Old EKG Reviewed: unchanged No significant change in EKG from 07/07/2011    Labs Reviewed  CBC - Abnormal; Notable for the following:    Hemoglobin 15.4 (*)    HCT 46.7 (*)    All other components within normal limits  COMPREHENSIVE METABOLIC PANEL - Abnormal; Notable for the following:    Glucose, Bld 201 (*)    Creatinine, Ser 0.48 (*)    Albumin 3.2 (*)    Alkaline Phosphatase 192 (*)    All other components within normal limits   Dg Chest 1 View  07/20/2011  *RADIOLOGY REPORT*  Clinical Data: Fall, question hip fracture  CHEST - 1 VIEW  Comparison: Chest radiograph 06/20/2011  Findings: Stable cardiac silhouette.  No effusion, infiltrate, pneumothorax. There is a fracture of the left humeral head which is an impaction type fracture.  Difficult to tell the chronicity of this fracture.  IMPRESSION:  Fracture of the left proximal humerus.  Original Report Authenticated By: Genevive Bi, M.D.   Dg Hip Complete Left  07/20/2011  *RADIOLOGY REPORT*  Clinical Data: Left hip pain  LEFT HIP -  COMPLETE 2+ VIEW  Comparison: Plain films 07/06/2011 the  Findings: There is a sub capital fracture of the left femoral neck with varus angulation.  Hip is located.  IMPRESSION: Subcapital left femoral neck fracture.  Original Report Authenticated By: Genevive Bi, M.D.   Ct Head Wo Contrast  07/20/2011  *RADIOLOGY REPORT*  Clinical Data:  Hip pain, fall, the  CT HEAD WITHOUT CONTRAST  CT CERVICAL SPINE WITHOUT CONTRAST  Technique:  Multidetector CT imaging of the head and cervical spine was performed following the standard protocol without intravenous contrast.  Multiplanar CT image reconstructions of the cervical spine were also generated.  Comparison:  Head CT 06/30/2011  CT HEAD  Findings: There is a small scalp hematoma over the lateral left frontal bone.  There is no evidence of intracranial hemorrhage.  No focal mass lesion.  No CT evidence of acute infarction.  There is generalized cortical atrophy.  Periventricular white matter hypodensities are present.  There is no evidence skull fracture.  No fluid the paranasal sinuses or mastoid air cells.  IMPRESSION:  1.  Small left scalp hematoma. 2.  No evidence of intracranial trauma. 3.  Atrophy and microvascular disease.  CT CERVICAL SPINE  Findings: There is no prevertebral soft tissue swelling.  Normal alignment cervical pain bodies.  There is endplate osteophytosis and loss of disc space in the lower cervical spine.  Normal cervical facet articulation.  Normal craniocervical junction.  No evidence epidural paraspinal hematoma.  IMPRESSION:  1.  No evidence of cervical spine fracture. 2.  Multilevel spondylosis.  Original Report Authenticated By: Genevive Bi, M.D.   Ct Cervical Spine Wo Contrast  07/20/2011  *RADIOLOGY REPORT*  Clinical Data:  Hip pain, fall, the  CT HEAD WITHOUT CONTRAST CT CERVICAL SPINE WITHOUT CONTRAST  Technique:  Multidetector CT imaging of the head and cervical spine was performed following the standard protocol without  intravenous contrast.  Multiplanar CT image reconstructions of the cervical spine were also generated.  Comparison:  Head CT 06/30/2011  CT HEAD  Findings: There is a small scalp hematoma over the lateral left frontal bone.  There is no evidence of intracranial hemorrhage.  No focal mass lesion.  No CT evidence of acute infarction.  There is generalized cortical atrophy.  Periventricular white matter hypodensities are present.  There is no evidence skull fracture.  No fluid the paranasal sinuses or mastoid air cells.  IMPRESSION:  1.  Small left scalp hematoma. 2.  No evidence of intracranial trauma. 3.  Atrophy and microvascular disease.  CT CERVICAL SPINE  Findings: There is no prevertebral soft tissue swelling.  Normal alignment cervical pain bodies.  There is endplate osteophytosis and loss of disc space in the lower cervical spine.  Normal cervical facet articulation.  Normal craniocervical junction.  No evidence epidural paraspinal hematoma.  IMPRESSION:  1.  No evidence of cervical spine fracture. 2.  Multilevel spondylosis.  Original Report Authenticated By: Genevive Bi, M.D.   Results for orders placed during the hospital encounter of 07/20/11  CBC      Component Value Range   WBC 4.7  4.0 - 10.5 (K/uL)   RBC 4.97  3.87 - 5.11 (MIL/uL)   Hemoglobin 15.4 (*) 12.0 - 15.0 (g/dL)   HCT 16.1 (*) 09.6 - 46.0 (%)   MCV 94.0  78.0 - 100.0 (fL)   MCH 31.0  26.0 - 34.0 (pg)   MCHC 33.0  30.0 - 36.0 (g/dL)   RDW 04.5  40.9 - 81.1 (%)   Platelets 327  150 - 400 (K/uL)  COMPREHENSIVE METABOLIC PANEL      Component Value Range   Sodium 137  135 - 145 (mEq/L)   Potassium 3.5  3.5 - 5.1 (mEq/L)   Chloride 100  96 - 112 (mEq/L)   CO2 23  19 - 32 (mEq/L)   Glucose, Bld 201 (*) 70 - 99 (mg/dL)   BUN  11  6 - 23 (mg/dL)   Creatinine, Ser 1.61 (*) 0.50 - 1.10 (mg/dL)   Calcium 9.9  8.4 - 09.6 (mg/dL)   Total Protein 7.1  6.0 - 8.3 (g/dL)   Albumin 3.2 (*) 3.5 - 5.2 (g/dL)   AST 23  0 - 37 (U/L)    ALT 20  0 - 35 (U/L)   Alkaline Phosphatase 192 (*) 39 - 117 (U/L)   Total Bilirubin 0.6  0.3 - 1.2 (mg/dL)   GFR calc non Af Amer >90  >90 (mL/min)   GFR calc Af Amer >90  >90 (mL/min)     1. Hip fracture, left       MDM  Left hip fracture as per x-rays. Discussed with on call orthopedics Dr. Hilda Lias, he will consult. Hospitalist will admit to telemetry team 2. patient status post fall at consider nursing home earlier today resulting in left hip fracture also has a small contusion to left occiput but with negative head CT and CT of the neck.       Shelda Jakes, MD 07/22/11 2151

## 2011-07-20 NOTE — ED Notes (Signed)
Patient removed from back board by MD with nurse assistance. Patient tolerated well. Complaining of pain in left upper leg.

## 2011-07-20 NOTE — ED Notes (Signed)
Patient requesting something to drink. Advised patient she cannot have anything to eat or drink due to possible surgery in the morning.

## 2011-07-20 NOTE — ED Notes (Signed)
MD aware Pt is on backboard.

## 2011-07-21 ENCOUNTER — Encounter (HOSPITAL_COMMUNITY): Payer: Self-pay

## 2011-07-21 LAB — TYPE AND SCREEN

## 2011-07-21 LAB — BASIC METABOLIC PANEL
BUN: 10 mg/dL (ref 6–23)
Chloride: 108 mEq/L (ref 96–112)
Creatinine, Ser: 0.44 mg/dL — ABNORMAL LOW (ref 0.50–1.10)
GFR calc Af Amer: >90 mL/min (ref >90)
GFR calc non Af Amer: >90 mL/min (ref >90)
Glucose, Bld: 193 mg/dL — ABNORMAL HIGH (ref 70–99)
Potassium: 3.4 mEq/L — ABNORMAL LOW (ref 3.5–5.1)

## 2011-07-21 LAB — DIFFERENTIAL
Basophils Absolute: 0 10*3/uL (ref 0.0–0.1)
Eosinophils Relative: 0 % (ref 0–5)
Lymphocytes Relative: 10 % — ABNORMAL LOW (ref 12–46)
Lymphs Abs: 0.8 10*3/uL (ref 0.7–4.0)
Monocytes Absolute: 0.7 10*3/uL (ref 0.1–1.0)
Monocytes Relative: 9 % (ref 3–12)
Neutro Abs: 6.4 10*3/uL (ref 1.7–7.7)

## 2011-07-21 LAB — CBC
HCT: 31 % — ABNORMAL LOW (ref 36.0–46.0)
HCT: 30 % — ABNORMAL LOW (ref 36.0–46.0)
Hemoglobin: 10.3 g/dL — ABNORMAL LOW (ref 12.0–15.0)
Hemoglobin: 10.1 g/dL — ABNORMAL LOW (ref 12.0–15.0)
MCH: 31.5 pg (ref 26.0–34.0)
MCHC: 33.2 g/dL (ref 30.0–36.0)
MCV: 94.9 fL (ref 78.0–100.0)
RBC: 3.16 MIL/uL — ABNORMAL LOW (ref 3.87–5.11)
RDW: 15.6 % — ABNORMAL HIGH (ref 11.5–15.5)
WBC: 7.9 10*3/uL (ref 4.0–10.5)

## 2011-07-21 LAB — PROTIME-INR: INR: 1.1 (ref 0.00–1.49)

## 2011-07-21 LAB — PREPARE RBC (CROSSMATCH)

## 2011-07-21 LAB — ABO/RH: ABO/RH(D): O POS

## 2011-07-21 MED ORDER — ALPRAZOLAM 0.25 MG PO TABS
0.2500 mg | ORAL_TABLET | Freq: Once | ORAL | Status: AC
  Administered 2011-07-21: 0.25 mg via ORAL
  Filled 2011-07-21: qty 1

## 2011-07-21 MED ORDER — MORPHINE SULFATE 2 MG/ML IJ SOLN
1.0000 mg | INTRAMUSCULAR | Status: DC | PRN
Start: 2011-07-21 — End: 2011-07-25
  Administered 2011-07-21 – 2011-07-24 (×5): 1 mg via INTRAVENOUS
  Filled 2011-07-21 (×5): qty 1

## 2011-07-21 MED ORDER — POVIDONE-IODINE 10 % EX SOLN
Freq: Once | CUTANEOUS | Status: AC
  Administered 2011-07-21: 22:00:00 via TOPICAL
  Filled 2011-07-21: qty 118

## 2011-07-21 MED ORDER — ONDANSETRON HCL 4 MG/2ML IJ SOLN
4.0000 mg | Freq: Four times a day (QID) | INTRAMUSCULAR | Status: DC | PRN
  Filled 2011-07-21: qty 2

## 2011-07-21 MED ORDER — POTASSIUM CHLORIDE IN NACL 20-0.9 MEQ/L-% IV SOLN
INTRAVENOUS | Status: DC
  Administered 2011-07-21: 02:00:00 via INTRAVENOUS

## 2011-07-21 MED ORDER — BIMATOPROST 0.03 % OP SOLN
1.0000 [drp] | Freq: Every day | OPHTHALMIC | Status: DC
  Administered 2011-07-21 – 2011-07-26 (×6): 1 [drp] via OPHTHALMIC
  Filled 2011-07-21: qty 2.5

## 2011-07-21 MED ORDER — ONDANSETRON HCL 4 MG PO TABS
4.0000 mg | ORAL_TABLET | Freq: Four times a day (QID) | ORAL | Status: DC | PRN
  Filled 2011-07-21: qty 1

## 2011-07-21 MED ORDER — CEFAZOLIN SODIUM 1-5 GM-% IV SOLN
1.0000 g | Freq: Once | INTRAVENOUS | Status: DC
  Filled 2011-07-21: qty 50

## 2011-07-21 MED ORDER — POTASSIUM CHLORIDE IN NACL 40-0.9 MEQ/L-% IV SOLN
INTRAVENOUS | Status: DC
  Administered 2011-07-21 – 2011-07-22 (×2): via INTRAVENOUS
  Filled 2011-07-21 (×5): qty 1000

## 2011-07-21 MED ORDER — BIMATOPROST 0.03 % OP SOLN
OPHTHALMIC | Status: AC
  Filled 2011-07-21: qty 2.5

## 2011-07-21 NOTE — Progress Notes (Signed)
Subjective: This lady had fracture of the left hip. According to the daughter, who is at the bedside, she thinks that excessive Xanax made her drowsy and therefore resulted in a fall. Also, from her last admission, she has adverse reactions to Percocet.           Physical Exam: Blood pressure 149/70, pulse 116, temperature 98.6 F (37 C), temperature source Oral, resp. rate 24, height 5\' 6"  (1.676 m), weight 67.586 kg (149 lb), SpO2 96.00%. She looks systemically well and appears to be calm and not agitated. She does not speak much. Heart sounds are present and normal. Lung fields are clear. She is alert.   Investigations:  Recent Results (from the past 240 hour(s))  MRSA PCR SCREENING     Status: Normal   Collection Time   07/21/11  3:20 AM      Component Value Range Status Comment   MRSA by PCR NEGATIVE  NEGATIVE  Final      Basic Metabolic Panel:  [facility] 07/21/11 0625 07/20/11 1945  NA 139 137  K 3.4* 3.5  CL 108 100  CO2 24 23  GLUCOSE 193* 201*  BUN 10 11  CREATININE 0.44* 0.48*  CALCIUM 8.9 9.9  MG -- --  PHOS -- --   Liver Function Tests:  [facility] 07/20/11 1945  AST 23  ALT 20  ALKPHOS 192*  BILITOT 0.6  PROT 7.1  ALBUMIN 3.2*     CBC:  [facility] 07/21/11 0625 07/20/11 1945  WBC 8.1 4.7  NEUTROABS -- --  HGB 10.3* 15.4*  HCT 31.0* 46.7*  MCV 94.8 94.0  PLT 445* 327    Dg Chest 1 View  07/20/2011  *RADIOLOGY REPORT*  Clinical Data: Fall, question hip fracture  CHEST - 1 VIEW  Comparison: Chest radiograph 06/20/2011  Findings: Stable cardiac silhouette.  No effusion, infiltrate, pneumothorax. There is a fracture of the left humeral head which is an impaction type fracture.  Difficult to tell the chronicity of this fracture.  IMPRESSION:  Fracture of the left proximal humerus.  Original Report Authenticated By: Genevive Bi, M.D.   Dg Hip Complete Left  07/20/2011  *RADIOLOGY REPORT*  Clinical Data: Left hip pain  LEFT HIP - COMPLETE 2+ VIEW   Comparison: Plain films 07/06/2011 the  Findings: There is a sub capital fracture of the left femoral neck with varus angulation.  Hip is located.  IMPRESSION: Subcapital left femoral neck fracture.  Original Report Authenticated By: Genevive Bi, M.D.   Ct Head Wo Contrast  07/20/2011  *RADIOLOGY REPORT*  Clinical Data:  Hip pain, fall, the  CT HEAD WITHOUT CONTRAST CT CERVICAL SPINE WITHOUT CONTRAST  Technique:  Multidetector CT imaging of the head and cervical spine was performed following the standard protocol without intravenous contrast.  Multiplanar CT image reconstructions of the cervical spine were also generated.  Comparison:  Head CT 06/30/2011  CT HEAD  Findings: There is a small scalp hematoma over the lateral left frontal bone.  There is no evidence of intracranial hemorrhage.  No focal mass lesion.  No CT evidence of acute infarction.  There is generalized cortical atrophy.  Periventricular white matter hypodensities are present.  There is no evidence skull fracture.  No fluid the paranasal sinuses or mastoid air cells.  IMPRESSION:  1.  Small left scalp hematoma. 2.  No evidence of intracranial trauma. 3.  Atrophy and microvascular disease.  CT CERVICAL SPINE  Findings: There is no prevertebral soft tissue swelling.  Normal alignment cervical  pain bodies.  There is endplate osteophytosis and loss of disc space in the lower cervical spine.  Normal cervical facet articulation.  Normal craniocervical junction.  No evidence epidural paraspinal hematoma.  IMPRESSION:  1.  No evidence of cervical spine fracture. 2.  Multilevel spondylosis.  Original Report Authenticated By: Genevive Bi, M.D.   Ct Cervical Spine Wo Contrast  07/20/2011  *RADIOLOGY REPORT*  Clinical Data:  Hip pain, fall, the  CT HEAD WITHOUT CONTRAST CT CERVICAL SPINE WITHOUT CONTRAST  Technique:  Multidetector CT imaging of the head and cervical spine was performed following the standard protocol without intravenous contrast.   Multiplanar CT image reconstructions of the cervical spine were also generated.  Comparison:  Head CT 06/30/2011  CT HEAD  Findings: There is a small scalp hematoma over the lateral left frontal bone.  There is no evidence of intracranial hemorrhage.  No focal mass lesion.  No CT evidence of acute infarction.  There is generalized cortical atrophy.  Periventricular white matter hypodensities are present.  There is no evidence skull fracture.  No fluid the paranasal sinuses or mastoid air cells.  IMPRESSION:  1.  Small left scalp hematoma. 2.  No evidence of intracranial trauma. 3.  Atrophy and microvascular disease.  CT CERVICAL SPINE  Findings: There is no prevertebral soft tissue swelling.  Normal alignment cervical pain bodies.  There is endplate osteophytosis and loss of disc space in the lower cervical spine.  Normal cervical facet articulation.  Normal craniocervical junction.  No evidence epidural paraspinal hematoma.  IMPRESSION:  1.  No evidence of cervical spine fracture. 2.  Multilevel spondylosis.  Original Report Authenticated By: Genevive Bi, M.D.      Medications: I have reviewed the patient's current medications.  Impression: 1. Left hip fracture. 2. Moderate dementia. 3. Hypokalemia.     Plan: 1. Await orthopedic opinion. She is medically stable for surgery. 2. Replete potassium. 3. After discussion with patient's son and daughter at the bedside, patient is now DO NOT RESUSCITATE.     LOS: 1 day   Wilson Singer Pager [phone]  07/21/2011, 8:52 AM

## 2011-07-21 NOTE — Consult Note (Signed)
Reason for Consult:fracture of the left hip Referring Physician: hospitalist  Michelle Sweeney is an 78 y.o. female.  HPI: She fell at local nursing home.  She has had multiple falls recently.  She has fracture of the left proximal humerus about three weeks old, fracture of the distal lateral humerus nondisplaced about the same age and also fracture of the wrist on the left.  She is in a long arm cast.  She is being treated by orthopedist in [place].  She fell yesterday.  She could not stand.  She had x-rays showing a new, acute fracture of the left hip, subcapital, displaced.  She had no head injury.  She is chronically disoriented secondary to Alzheimer's disease.  She is also very deconditioned and had diabetes, type two.  I have talked to her daughter about the mother's condition.  I have gone over in detail about possible risks and imponderables including infection, embolus which could cause death, need for physical therapy, possible blood transfusion, possible that her mother will not do well in physical therapy due to confusion and anesthesia risks among others.  She asked appropriated questions.  She will need to go to rehab/nursing home post surgery.  If she should fall again, that would be a very difficult problem if she "rebroke" her hip or other bones.  She is at increased risk.  I plan to do surgery tomorrow am.  I will monitor blood as her HGB fell since admission.  I will type and cross.  Past Medical History  Diagnosis Date  . IBS (irritable bowel syndrome)   . HTN (hypertension)   . Osteoporosis   . Hyperlipidemia   . Hyperkalemia   . Diabetes mellitus   . Alzheimer disease   . Arthritis     Past Surgical History  Procedure Date  . Abdominal hysterectomy   . Cardiac catheterization     History reviewed. No pertinent family history.  Social History:  reports that she has never smoked. She does not have any smokeless tobacco history on file. She reports that she does not  drink alcohol or use illicit drugs.  Allergies:  Allergies  Allergen Reactions  . Penicillins   . Quinine Derivatives     Medications: I have reviewed the patient's current medications.  Results for orders placed during the hospital encounter of 07/20/11 (from the past 48 hour(s))  CBC     Status: Abnormal   Collection Time   07/20/11  7:45 PM      Component Value Range Comment   WBC 4.7  4.0 - 10.5 (K/uL)    RBC 4.97  3.87 - 5.11 (MIL/uL)    Hemoglobin 15.4 (*) 12.0 - 15.0 (g/dL)    HCT 78.2 (*) 95.6 - 46.0 (%)    MCV 94.0  78.0 - 100.0 (fL)    MCH 31.0  26.0 - 34.0 (pg)    MCHC 33.0  30.0 - 36.0 (g/dL)    RDW 21.3  08.6 - 57.8 (%)    Platelets 327  150 - 400 (K/uL)   COMPREHENSIVE METABOLIC PANEL     Status: Abnormal   Collection Time   07/20/11  7:45 PM      Component Value Range Comment   Sodium 137  135 - 145 (mEq/L)    Potassium 3.5  3.5 - 5.1 (mEq/L)    Chloride 100  96 - 112 (mEq/L)    CO2 23  19 - 32 (mEq/L)    Glucose, Bld 201 (*) 70 -  99 (mg/dL)    BUN 11  6 - 23 (mg/dL)    Creatinine, Ser 1.61 (*) 0.50 - 1.10 (mg/dL)    Calcium 9.9  8.4 - 10.5 (mg/dL)    Total Protein 7.1  6.0 - 8.3 (g/dL)    Albumin 3.2 (*) 3.5 - 5.2 (g/dL)    AST 23  0 - 37 (U/L)    ALT 20  0 - 35 (U/L)    Alkaline Phosphatase 192 (*) 39 - 117 (U/L)    Total Bilirubin 0.6  0.3 - 1.2 (mg/dL)    GFR calc non Af Amer >90  >90 (mL/min)    GFR calc Af Amer >90  >90 (mL/min)   URINALYSIS, ROUTINE W REFLEX MICROSCOPIC     Status: Abnormal   Collection Time   07/20/11 10:05 PM      Component Value Range Comment   Color, Urine AMBER (*) YELLOW  BIOCHEMICALS MAY BE AFFECTED BY COLOR   APPearance CLOUDY (*) CLEAR     Specific Gravity, Urine >1.030 (*) 1.005 - 1.030     pH 5.0  5.0 - 8.0     Glucose, UA 500 (*) NEGATIVE (mg/dL)    Hgb urine dipstick SMALL (*) NEGATIVE     Bilirubin Urine NEGATIVE  NEGATIVE     Ketones, ur 15 (*) NEGATIVE (mg/dL)    Protein, ur NEGATIVE  NEGATIVE (mg/dL)     Urobilinogen, UA 0.2  0.0 - 1.0 (mg/dL)    Nitrite POSITIVE (*) NEGATIVE     Leukocytes, UA NEGATIVE  NEGATIVE    URINE MICROSCOPIC-ADD ON     Status: Abnormal   Collection Time   07/20/11 10:05 PM      Component Value Range Comment   Squamous Epithelial / LPF RARE  RARE     WBC, UA 0-2  <3 (WBC/hpf)    RBC / HPF 0-2  <3 (RBC/hpf)    Bacteria, UA MANY (*) RARE    MRSA PCR SCREENING     Status: Normal   Collection Time   07/21/11  3:20 AM      Component Value Range Comment   MRSA by PCR NEGATIVE  NEGATIVE    BASIC METABOLIC PANEL     Status: Abnormal   Collection Time   07/21/11  6:25 AM      Component Value Range Comment   Sodium 139  135 - 145 (mEq/L)    Potassium 3.4 (*) 3.5 - 5.1 (mEq/L)    Chloride 108  96 - 112 (mEq/L)    CO2 24  19 - 32 (mEq/L)    Glucose, Bld 193 (*) 70 - 99 (mg/dL)    BUN 10  6 - 23 (mg/dL)    Creatinine, Ser 0.96 (*) 0.50 - 1.10 (mg/dL)    Calcium 8.9  8.4 - 10.5 (mg/dL)    GFR calc non Af Amer >90  >90 (mL/min)    GFR calc Af Amer >90  >90 (mL/min)   CBC     Status: Abnormal   Collection Time   07/21/11  6:25 AM      Component Value Range Comment   WBC 8.1  4.0 - 10.5 (K/uL)    RBC 3.27 (*) 3.87 - 5.11 (MIL/uL)    Hemoglobin 10.3 (*) 12.0 - 15.0 (g/dL)    HCT 04.5 (*) 40.9 - 46.0 (%)    MCV 94.8  78.0 - 100.0 (fL)    MCH 31.5  26.0 - 34.0 (pg)    MCHC 33.2  30.0 - 36.0 (g/dL)    RDW 86.5 (*) 78.4 - 15.5 (%)    Platelets 445 (*) 150 - 400 (K/uL) DELTA CHECK NOTED  PROTIME-INR     Status: Normal   Collection Time   07/21/11  6:25 AM      Component Value Range Comment   Prothrombin Time 14.4  11.6 - 15.2 (seconds)    INR 1.10  0.00 - 1.49      Dg Chest 1 View  07/20/2011  *RADIOLOGY REPORT*  Clinical Data: Fall, question hip fracture  CHEST - 1 VIEW  Comparison: Chest radiograph 06/20/2011  Findings: Stable cardiac silhouette.  No effusion, infiltrate, pneumothorax. There is a fracture of the left humeral head which is an impaction type fracture.   Difficult to tell the chronicity of this fracture.  IMPRESSION:  Fracture of the left proximal humerus.  Original Report Authenticated By: Genevive Bi, M.D.   Dg Hip Complete Left  07/20/2011  *RADIOLOGY REPORT*  Clinical Data: Left hip pain  LEFT HIP - COMPLETE 2+ VIEW  Comparison: Plain films 07/06/2011 the  Findings: There is a sub capital fracture of the left femoral neck with varus angulation.  Hip is located.  IMPRESSION: Subcapital left femoral neck fracture.  Original Report Authenticated By: Genevive Bi, M.D.   Ct Head Wo Contrast  07/20/2011  *RADIOLOGY REPORT*  Clinical Data:  Hip pain, fall, the  CT HEAD WITHOUT CONTRAST CT CERVICAL SPINE WITHOUT CONTRAST  Technique:  Multidetector CT imaging of the head and cervical spine was performed following the standard protocol without intravenous contrast.  Multiplanar CT image reconstructions of the cervical spine were also generated.  Comparison:  Head CT 06/30/2011  CT HEAD  Findings: There is a small scalp hematoma over the lateral left frontal bone.  There is no evidence of intracranial hemorrhage.  No focal mass lesion.  No CT evidence of acute infarction.  There is generalized cortical atrophy.  Periventricular white matter hypodensities are present.  There is no evidence skull fracture.  No fluid the paranasal sinuses or mastoid air cells.  IMPRESSION:  1.  Small left scalp hematoma. 2.  No evidence of intracranial trauma. 3.  Atrophy and microvascular disease.  CT CERVICAL SPINE  Findings: There is no prevertebral soft tissue swelling.  Normal alignment cervical pain bodies.  There is endplate osteophytosis and loss of disc space in the lower cervical spine.  Normal cervical facet articulation.  Normal craniocervical junction.  No evidence epidural paraspinal hematoma.  IMPRESSION:  1.  No evidence of cervical spine fracture. 2.  Multilevel spondylosis.  Original Report Authenticated By: Genevive Bi, M.D.   Ct Cervical Spine Wo  Contrast  07/20/2011  *RADIOLOGY REPORT*  Clinical Data:  Hip pain, fall, the  CT HEAD WITHOUT CONTRAST CT CERVICAL SPINE WITHOUT CONTRAST  Technique:  Multidetector CT imaging of the head and cervical spine was performed following the standard protocol without intravenous contrast.  Multiplanar CT image reconstructions of the cervical spine were also generated.  Comparison:  Head CT 06/30/2011  CT HEAD  Findings: There is a small scalp hematoma over the lateral left frontal bone.  There is no evidence of intracranial hemorrhage.  No focal mass lesion.  No CT evidence of acute infarction.  There is generalized cortical atrophy.  Periventricular white matter hypodensities are present.  There is no evidence skull fracture.  No fluid the paranasal sinuses or mastoid air cells.  IMPRESSION:  1.  Small left scalp hematoma. 2.  No evidence  of intracranial trauma. 3.  Atrophy and microvascular disease.  CT CERVICAL SPINE  Findings: There is no prevertebral soft tissue swelling.  Normal alignment cervical pain bodies.  There is endplate osteophytosis and loss of disc space in the lower cervical spine.  Normal cervical facet articulation.  Normal craniocervical junction.  No evidence epidural paraspinal hematoma.  IMPRESSION:  1.  No evidence of cervical spine fracture. 2.  Multilevel spondylosis.  Original Report Authenticated By: Genevive Bi, M.D.    Review of Systems  Constitutional: Positive for weight loss and malaise/fatigue.  HENT: Negative.   Eyes: Negative.   Respiratory: Negative.   Cardiovascular: Negative.   Gastrointestinal: Negative.   Genitourinary: Negative.   Musculoskeletal: Positive for falls (She has had many falls with recent fractures of the left proximal humerus, left lateral condyle of elbow, left wrist.  She is in a cast long arm on the left.).  Skin: Negative.   Neurological: Negative.   Endo/Heme/Allergies: Negative.   Psychiatric/Behavioral:       History of disorientation,  Alzheimer disease.   Blood pressure 149/70, pulse 116, temperature 98.6 F (37 C), temperature source Oral, resp. rate 24, height 5\' 6"  (1.676 m), weight 67.586 kg (149 lb), SpO2 96.00%. Physical Exam  Constitutional: She appears well-developed and well-nourished.  HENT:  Head: Normocephalic and atraumatic.  Eyes: Conjunctivae and EOM are normal. Pupils are equal, round, and reactive to light.  Neck: Normal range of motion. Neck supple.  Cardiovascular: Normal rate, regular rhythm, normal heart sounds and intact distal pulses.   Respiratory: Effort normal and breath sounds normal.  GI: Soft. Bowel sounds are normal.  Musculoskeletal: She exhibits tenderness (Pain left hip to any motion, shortened, externally rotated leg.  Long arm cast on left.  Pain in left shoulder.  NV intact to hand and fingers on left and right.).       Left shoulder: She exhibits decreased range of motion and tenderness.       Left elbow: She exhibits decreased range of motion (In long arm cast on the left).       Left hip: She exhibits decreased range of motion and crepitus.       Legs: Neurological: She is alert. She has normal reflexes.  Skin: Skin is warm and dry.  Psychiatric: She has a normal mood and affect. Her behavior is normal.       She is disoriented but pleasant.    Assessment/Plan: Subcapital fracture of the left hip, acute, displaced, closed.  Recent within three weeks fractures of the left proximal humerus, left distal humerus and left wrist.  Left long arm cast in place.  Alzheimer's disorientation.  Plan surgery tomorrow.  Plan bipolar hip.  Grayson White 07/21/2011, 11:13 AM

## 2011-07-21 NOTE — Progress Notes (Signed)
UR Chart Review Completed  

## 2011-07-21 NOTE — Progress Notes (Signed)
Night shift nurse reported family wished for Dr. Jenelle Mages to consult with patient related to hip fracture.  Dr. Hilda Lias notified of families wishes and told staff to notify Dr. Jenelle Mages.  Secretary on department 300, Tammy called and will contact Dr. Jenelle Mages with consult information.  Daughter at bedside and updated on request.

## 2011-07-22 ENCOUNTER — Encounter (HOSPITAL_COMMUNITY)
Admission: EM | Disposition: A | Discharge status: Skilled Nursing Facility | Payer: Self-pay | Source: Home / Self Care | Attending: Internal Medicine

## 2011-07-22 ENCOUNTER — Encounter (HOSPITAL_COMMUNITY): Payer: Self-pay | Admitting: Anesthesiology

## 2011-07-22 ENCOUNTER — Inpatient Hospital Stay (HOSPITAL_COMMUNITY): Payer: Medicare Other

## 2011-07-22 ENCOUNTER — Inpatient Hospital Stay (HOSPITAL_COMMUNITY): Payer: Medicare Other | Admitting: Anesthesiology

## 2011-07-22 ENCOUNTER — Other Ambulatory Visit: Payer: Self-pay | Admitting: Orthopaedic Surgery

## 2011-07-22 ENCOUNTER — Encounter (HOSPITAL_COMMUNITY): Payer: Self-pay | Admitting: *Deleted

## 2011-07-22 HISTORY — PX: HIP ARTHROPLASTY: SHX981

## 2011-07-22 LAB — CBC
Hemoglobin: 10.4 g/dL — ABNORMAL LOW (ref 12.0–15.0)
MCH: 32.1 pg (ref 26.0–34.0)
MCHC: 33.7 g/dL (ref 30.0–36.0)
Platelets: 401 10*3/uL — ABNORMAL HIGH (ref 150–400)
RBC: 3.24 MIL/uL — ABNORMAL LOW (ref 3.87–5.11)

## 2011-07-22 LAB — COMPREHENSIVE METABOLIC PANEL
ALT: 12 U/L (ref 0–35)
AST: 14 U/L (ref 0–37)
Alkaline Phosphatase: 137 U/L — ABNORMAL HIGH (ref 39–117)
CO2: 25 mEq/L (ref 19–32)
Calcium: 9 mg/dL (ref 8.4–10.5)
Potassium: 3.7 mEq/L (ref 3.5–5.1)
Sodium: 137 mEq/L (ref 135–145)
Total Protein: 5.9 g/dL — ABNORMAL LOW (ref 6.0–8.3)

## 2011-07-22 LAB — WOUND CULTURE: Culture: NO GROWTH

## 2011-07-22 LAB — GLUCOSE, CAPILLARY: Glucose-Capillary: 169 mg/dL — ABNORMAL HIGH (ref 70–99)

## 2011-07-22 SURGERY — HEMIARTHROPLASTY, HIP, DIRECT ANTERIOR APPROACH, FOR FRACTURE
Anesthesia: Spinal | Site: Hip | Laterality: Left | Wound class: Clean

## 2011-07-22 MED ORDER — MAGNESIUM HYDROXIDE 400 MG/5ML PO SUSP: 30.0000 mL | Freq: Every day | ORAL | Status: DC | PRN

## 2011-07-22 MED ORDER — POTASSIUM CHLORIDE CRYS ER 20 MEQ PO TBCR
20.0000 meq | EXTENDED_RELEASE_TABLET | Freq: Two times a day (BID) | ORAL | Status: DC
  Administered 2011-07-22 – 2011-07-24 (×6): 20 meq via ORAL
  Filled 2011-07-22 (×5): qty 1

## 2011-07-22 MED ORDER — PROMETHAZINE HCL 25 MG/ML IJ SOLN: 12.5000 mg | INTRAMUSCULAR | Status: DC | PRN

## 2011-07-22 MED ORDER — ENOXAPARIN SODIUM 30 MG/0.3ML ~~LOC~~ SOLN
30.0000 mg | SUBCUTANEOUS | Status: DC
  Administered 2011-07-23 – 2011-07-24 (×2): 30 mg via SUBCUTANEOUS
  Filled 2011-07-22 (×2): qty 0.3

## 2011-07-22 MED ORDER — DIPHENHYDRAMINE HCL 50 MG/ML IJ SOLN: 12.5000 mg | Freq: Four times a day (QID) | INTRAMUSCULAR | Status: DC | PRN

## 2011-07-22 MED ORDER — PROPOFOL 10 MG/ML IV EMUL
INTRAVENOUS | Status: AC
  Filled 2011-07-22: qty 20

## 2011-07-22 MED ORDER — FENTANYL CITRATE 0.05 MG/ML IJ SOLN
INTRAMUSCULAR | Status: AC
  Filled 2011-07-22: qty 2

## 2011-07-22 MED ORDER — BUPIVACAINE IN DEXTROSE 0.75-8.25 % IT SOLN
INTRATHECAL | Status: AC
  Filled 2011-07-22: qty 2

## 2011-07-22 MED ORDER — FLORA-Q PO CAPS
1.0000 | ORAL_CAPSULE | ORAL | Status: DC
  Administered 2011-07-23 – 2011-07-26 (×4): 1 via ORAL
  Filled 2011-07-22 (×4): qty 1

## 2011-07-22 MED ORDER — ACETAMINOPHEN 325 MG PO TABS: 650.0000 mg | ORAL_TABLET | Freq: Three times a day (TID) | ORAL | Status: DC

## 2011-07-22 MED ORDER — ACETAMINOPHEN 10 MG/ML IV SOLN
1000.0000 mg | Freq: Four times a day (QID) | INTRAVENOUS | Status: DC
  Administered 2011-07-22 – 2011-07-23 (×3): 1000 mg via INTRAVENOUS
  Filled 2011-07-22 (×4): qty 100

## 2011-07-22 MED ORDER — ALBUTEROL SULFATE (5 MG/ML) 0.5% IN NEBU
2.5000 mg | INHALATION_SOLUTION | Freq: Four times a day (QID) | RESPIRATORY_TRACT | Status: DC
Start: 2011-07-22 — End: 2011-07-23
  Administered 2011-07-22 – 2011-07-23 (×3): 2.5 mg via RESPIRATORY_TRACT
  Filled 2011-07-22 (×3): qty 0.5

## 2011-07-22 MED ORDER — POLYVINYL ALCOHOL 1.4 % OP SOLN
1.0000 [drp] | Freq: Three times a day (TID) | OPHTHALMIC | Status: DC
  Administered 2011-07-22 – 2011-07-27 (×15): 1 [drp] via OPHTHALMIC
  Filled 2011-07-22 (×3): qty 15

## 2011-07-22 MED ORDER — EPHEDRINE SULFATE 50 MG/ML IJ SOLN
INTRAMUSCULAR | Status: AC
  Filled 2011-07-22: qty 1

## 2011-07-22 MED ORDER — RIVASTIGMINE 9.5 MG/24HR TD PT24
9.5000 mg | MEDICATED_PATCH | Freq: Every day | TRANSDERMAL | Status: DC
  Administered 2011-07-22 – 2011-07-27 (×6): 9.5 mg via TRANSDERMAL
  Filled 2011-07-22 (×8): qty 1

## 2011-07-22 MED ORDER — CITALOPRAM HYDROBROMIDE 20 MG PO TABS
20.0000 mg | ORAL_TABLET | Freq: Every day | ORAL | Status: DC
  Administered 2011-07-22 – 2011-07-27 (×6): 20 mg via ORAL
  Filled 2011-07-22 (×6): qty 1

## 2011-07-22 MED ORDER — POTASSIUM CHLORIDE IN NACL 40-0.9 MEQ/L-% IV SOLN
INTRAVENOUS | Status: AC
  Filled 2011-07-22: qty 1000

## 2011-07-22 MED ORDER — CEFAZOLIN SODIUM 1-5 GM-% IV SOLN
INTRAVENOUS | Status: DC | PRN
  Administered 2011-07-22: 1 g via INTRAVENOUS

## 2011-07-22 MED ORDER — HALOPERIDOL 2 MG PO TABS: 1.0000 mg | ORAL_TABLET | Freq: Two times a day (BID) | ORAL | Status: DC | PRN

## 2011-07-22 MED ORDER — FENTANYL CITRATE 0.05 MG/ML IJ SOLN
INTRAMUSCULAR | Status: DC | PRN
  Administered 2011-07-22: 20 ug via INTRATHECAL
  Administered 2011-07-22: 25 ug via INTRAVENOUS

## 2011-07-22 MED ORDER — VITAMIN D3 25 MCG (1000 UNIT) PO TABS
1000.0000 [IU] | ORAL_TABLET | Freq: Every day | ORAL | Status: DC
  Administered 2011-07-22 – 2011-07-27 (×5): 1000 [IU] via ORAL
  Filled 2011-07-22 (×8): qty 1

## 2011-07-22 MED ORDER — NALOXONE HCL 0.4 MG/ML IJ SOLN: 0.4000 mg | INTRAMUSCULAR | Status: DC | PRN

## 2011-07-22 MED ORDER — MIDAZOLAM HCL 2 MG/2ML IJ SOLN: 1.0000 mg | INTRAMUSCULAR | Status: DC | PRN

## 2011-07-22 MED ORDER — EPHEDRINE SULFATE 50 MG/ML IJ SOLN
INTRAMUSCULAR | Status: DC | PRN
  Administered 2011-07-22 (×3): 10 mg via INTRAVENOUS

## 2011-07-22 MED ORDER — SODIUM CHLORIDE 0.9 % IJ SOLN
9.0000 mL | INTRAMUSCULAR | Status: DC | PRN
  Administered 2011-07-23: 9 mL via INTRAVENOUS

## 2011-07-22 MED ORDER — SODIUM CHLORIDE 0.9 % IR SOLN
Status: DC | PRN
  Administered 2011-07-22: 1000 mL

## 2011-07-22 MED ORDER — LOPERAMIDE HCL 2 MG PO CAPS
4.0000 mg | ORAL_CAPSULE | Freq: Three times a day (TID) | ORAL | Status: DC | PRN
  Administered 2011-07-24 – 2011-07-25 (×2): 4 mg via ORAL
  Filled 2011-07-22 (×2): qty 2

## 2011-07-22 MED ORDER — CEFAZOLIN SODIUM 1-5 GM-% IV SOLN
INTRAVENOUS | Status: AC
  Filled 2011-07-22: qty 50

## 2011-07-22 MED ORDER — ONDANSETRON HCL 4 MG/2ML IJ SOLN
4.0000 mg | Freq: Four times a day (QID) | INTRAMUSCULAR | Status: DC | PRN
  Administered 2011-07-27: 4 mg via INTRAVENOUS

## 2011-07-22 MED ORDER — MORPHINE SULFATE (PF) 1 MG/ML IV SOLN
INTRAVENOUS | Status: DC
  Administered 2011-07-22 (×2): 1 mg via INTRAVENOUS
  Administered 2011-07-22: 13:00:00 via INTRAVENOUS
  Administered 2011-07-22: 3 mg via INTRAVENOUS
  Filled 2011-07-22: qty 25

## 2011-07-22 MED ORDER — FENTANYL CITRATE 0.05 MG/ML IJ SOLN: 25.0000 ug | INTRAMUSCULAR | Status: DC | PRN

## 2011-07-22 MED ORDER — ALPRAZOLAM 0.25 MG PO TABS
0.2500 mg | ORAL_TABLET | Freq: Two times a day (BID) | ORAL | Status: DC
  Administered 2011-07-22 – 2011-07-26 (×9): 0.25 mg via ORAL
  Filled 2011-07-22 (×9): qty 1

## 2011-07-22 MED ORDER — LACTATED RINGERS IV SOLN: INTRAVENOUS | Status: DC

## 2011-07-22 MED ORDER — ASPIRIN EC 81 MG PO TBEC
81.0000 mg | DELAYED_RELEASE_TABLET | ORAL | Status: DC
  Administered 2011-07-23 – 2011-07-26 (×4): 81 mg via ORAL
  Filled 2011-07-22 (×4): qty 1

## 2011-07-22 MED ORDER — ONDANSETRON HCL 4 MG/2ML IJ SOLN: 4.0000 mg | Freq: Once | INTRAMUSCULAR | Status: DC | PRN

## 2011-07-22 MED ORDER — HYDROGEN PEROXIDE 3 % EX SOLN
CUTANEOUS | Status: DC | PRN
  Administered 2011-07-22: 1

## 2011-07-22 MED ORDER — PROPOFOL 10 MG/ML IV EMUL
INTRAVENOUS | Status: DC | PRN
  Administered 2011-07-22: 25 ug/kg/min via INTRAVENOUS

## 2011-07-22 MED ORDER — LACTATED RINGERS IV SOLN
INTRAVENOUS | Status: DC | PRN
  Administered 2011-07-22: 1000 mL
  Administered 2011-07-22: 07:00:00 via INTRAVENOUS

## 2011-07-22 MED ORDER — ACETAMINOPHEN 325 MG PO TABS
650.0000 mg | ORAL_TABLET | Freq: Four times a day (QID) | ORAL | Status: DC | PRN
  Administered 2011-07-23 – 2011-07-24 (×2): 650 mg via ORAL
  Filled 2011-07-22 (×2): qty 2

## 2011-07-22 MED ORDER — SIMVASTATIN 20 MG PO TABS
20.0000 mg | ORAL_TABLET | Freq: Every day | ORAL | Status: DC
  Administered 2011-07-22 – 2011-07-26 (×5): 20 mg via ORAL
  Filled 2011-07-22 (×5): qty 1

## 2011-07-22 MED ORDER — DIPHENHYDRAMINE HCL 12.5 MG/5ML PO ELIX: 12.5000 mg | ORAL_SOLUTION | Freq: Four times a day (QID) | ORAL | Status: DC | PRN

## 2011-07-22 MED ORDER — LIDOCAINE HCL (PF) 1 % IJ SOLN
INTRAMUSCULAR | Status: AC
  Filled 2011-07-22: qty 5

## 2011-07-22 SURGICAL SUPPLY — 54 items
BAG HAMPER (MISCELLANEOUS) ×2 IMPLANT
BLADE SAGITTAL 25.0X1.27X90 (BLADE) ×2 IMPLANT
BLADE SURG SZ20 CARB STEEL (BLADE) ×2 IMPLANT
CLOTH BEACON ORANGE TIMEOUT ST (SAFETY) ×2 IMPLANT
COVER LIGHT HANDLE STERIS (MISCELLANEOUS) ×4 IMPLANT
COVER MAYO STAND XLG (DRAPE) ×2 IMPLANT
COVER X RAY CASSETTE (MISCELLANEOUS) ×2 IMPLANT
DRAIN PENROSE 18X.75 LTX STRL (MISCELLANEOUS) ×2 IMPLANT
DRAPE ORTHO 2.5IN SPLIT 77X108 (DRAPES) ×1 IMPLANT
DRAPE ORTHO SPLIT 77X108 STRL (DRAPES) ×2
DRAPE PROXIMA HALF (DRAPES) ×2 IMPLANT
EVACUATOR 3/16  PVC DRAIN (DRAIN) ×1
EVACUATOR 3/16 PVC DRAIN (DRAIN) ×1 IMPLANT
FACESHIELD LNG OPTICON STERILE (SAFETY) ×2 IMPLANT
FORMALIN 10 PREFIL 480ML (MISCELLANEOUS) ×2 IMPLANT
GAUZE XEROFORM 5X9 LF (GAUZE/BANDAGES/DRESSINGS) ×2 IMPLANT
GLOVE BIO SURGEON STRL SZ8 (GLOVE) ×2 IMPLANT
GLOVE BIO SURGEON STRL SZ8.5 (GLOVE) ×2 IMPLANT
GLOVE BIOGEL PI IND STRL 7.0 (GLOVE) IMPLANT
GLOVE BIOGEL PI INDICATOR 7.0 (GLOVE) ×3
GLOVE ECLIPSE 6.5 STRL STRAW (GLOVE) ×3 IMPLANT
GOWN STRL REIN XL XLG (GOWN DISPOSABLE) ×6 IMPLANT
HANDPIECE INTERPULSE COAX TIP (DISPOSABLE)
HEAD 28MM 0 (Hips) ×1 IMPLANT
HEAD BIPOLAR 44MM (Hips) ×1 IMPLANT
INST SET MAJOR BONE (KITS) ×2 IMPLANT
IV NS IRRIG 3000ML ARTHROMATIC (IV SOLUTION) IMPLANT
KIT BLADEGUARD II DBL (SET/KITS/TRAYS/PACK) ×2 IMPLANT
KIT ROOM TURNOVER APOR (KITS) ×2 IMPLANT
MANIFOLD NEPTUNE II (INSTRUMENTS) ×2 IMPLANT
MARKER SKIN DUAL TIP RULER LAB (MISCELLANEOUS) ×2 IMPLANT
NS IRRIG 1000ML POUR BTL (IV SOLUTION) ×2 IMPLANT
PACK TOTAL JOINT (CUSTOM PROCEDURE TRAY) ×2 IMPLANT
PAD ABD 5X9 TENDERSORB (GAUZE/BANDAGES/DRESSINGS) ×4 IMPLANT
PAD ARMBOARD 7.5X6 YLW CONV (MISCELLANEOUS) ×2 IMPLANT
PILLOW HIP ABDUCTION LRG (ORTHOPEDIC SUPPLIES) IMPLANT
PILLOW HIP ABDUCTION MED (ORTHOPEDIC SUPPLIES) ×1 IMPLANT
SET BASIN LINEN APH (SET/KITS/TRAYS/PACK) ×2 IMPLANT
SET HNDPC FAN SPRY TIP SCT (DISPOSABLE) IMPLANT
SOL PREP PROV IODINE SCRUB 4OZ (MISCELLANEOUS) ×2 IMPLANT
SPONGE DRAIN TRACH 4X4 STRL 2S (GAUZE/BANDAGES/DRESSINGS) ×2 IMPLANT
SPONGE GAUZE 4X4 12PLY (GAUZE/BANDAGES/DRESSINGS) ×2 IMPLANT
STAPLER VISISTAT 35W (STAPLE) ×2 IMPLANT
STEM CONQUEST SZ 12 (Stem) ×1 IMPLANT
SUT BRALON NAB BRD #1 30IN (SUTURE) ×5 IMPLANT
SUT CHROMIC 1 CTX 36 (SUTURE) ×8 IMPLANT
SUT PLAIN 2 0 XLH (SUTURE) ×5 IMPLANT
SUT SILK 0 FSL (SUTURE) ×2 IMPLANT
SWAB CULTURE LIQ STUART DBL (MISCELLANEOUS) ×2 IMPLANT
TAPE MEDIFIX FOAM 3 (GAUZE/BANDAGES/DRESSINGS) ×2 IMPLANT
TOWER CARTRIDGE SMART MIX (DISPOSABLE) IMPLANT
TRAY FOLEY CATH 14FR (SET/KITS/TRAYS/PACK) ×1 IMPLANT
WATER STERILE IRR 1000ML POUR (IV SOLUTION) ×4 IMPLANT
YANKAUER SUCT 12FT TUBE ARGYLE (SUCTIONS) ×2 IMPLANT

## 2011-07-22 NOTE — Progress Notes (Signed)
Dr Jayme Cloud notified of of hgb 9.5 and hct 28.7. Order given to transfuse 1 unit PRBC.

## 2011-07-22 NOTE — Progress Notes (Signed)
Physical Therapy Evaluation Patient Details Name: MAUREEN DELATTE MRN: [id number] DOB: 18-May-1933 Today's Date: 07/22/2011  Problem List:  Patient Active Problem List  Diagnoses  . Fall  . Physical deconditioning  . Humerus fracture  . Alzheimer disease  . Anxiety disorder  . Glaucoma  . DM type 2 (diabetes mellitus, type 2)  . Delirium, in, senile dementia  . Hip fracture, left    Past Medical History:  Past Medical History  Diagnosis Date  . IBS (irritable bowel syndrome)   . HTN (hypertension)   . Osteoporosis   . Hyperlipidemia   . Hyperkalemia   . Diabetes mellitus   . Alzheimer disease   . Arthritis    Past Surgical History:  Past Surgical History  Procedure Date  . Abdominal hysterectomy   . Cardiac catheterization     PT Assessment/Plan/Recommendation PT Assessment Clinical Impression Statement: Pt with previous wrist fracture who has unfortunately fallen again breaking her left hip.  Pt will need skilled care to improve bed mobility and  transfers.  She will be discharged to SNF PT Problem List: Decreased strength;Decreased balance;Decreased knowledge of use of DME;Decreased safety awareness;Decreased knowledge of precautions;Pain;Decreased activity tolerance;Decreased range of motion;Decreased mobility;Decreased cognition PT Therapy Diagnosis : Difficulty walking;Generalized weakness;Acute pain PT Plan PT Frequency: Min 5X/week PT Treatment/Interventions: Gait training;DME instruction;Functional mobility training;Therapeutic activities;Therapeutic exercise PT Recommendation Follow Up Recommendations: Skilled nursing facility Equipment Recommended: None recommended by PT PT Goals  Acute Rehab PT Goals PT Goal Formulation: With family Time For Goal Achievement: 5 days Pt will go Supine/Side to Sit: with mod assist Pt will go Sit to Supine/Side: with mod assist Pt will go Sit to Stand: with min assist Pt will go Stand to Sit: with supervision Pt will  Ambulate: 1 - 15 feet;with mod assist;with other equipment (comment) (platform walker)  PT Evaluation Precautions/Restrictions  Restrictions Weight Bearing Restrictions: Yes LUE Weight Bearing: Non weight bearing LLE Weight Bearing: Touchdown weight bearing Prior Functioning  Home Living Lives With:  (assistive living but had been at [facility] following wrist) Receives Help From: Other (Comment) (staff) Type of Home: Assisted living Prior Function Level of Independence: Independent with basic ADLs;Independent with gait Driving: No Vocation: Retired Financial risk analyst Arousal/Alertness: Lethargic Overall Cognitive Status: Impaired History of Cognitive Impairment: Appears at baseline functioning Memory: Decreased recall of precautions Orientation Level:  (Hx of Alzheimers prior to fall) Safety/Judgement: Decreased safety judgement for tasks assessed   End of Session  Pt drowsy from surgery info from daughter.  Netha Dafoe,CINDY 07/22/2011, 4:26 PM

## 2011-07-22 NOTE — Preoperative (Signed)
Beta Blockers   Reason not to administer Beta Blockers:Not Applicable 

## 2011-07-22 NOTE — Transfer of Care (Signed)
Immediate Anesthesia Transfer of Care Note  Patient: Michelle Sweeney  Procedure(s) Performed:  ARTHROPLASTY BIPOLAR HIP  Patient Location: PACU  Anesthesia Type  Spinal  Level of Consciousness: sedated and patient cooperative  Airway & Oxygen Therapy: Patient Spontanous Breathing and Patient connected to nasal cannula oxygen  Post-op Assessment: Report given to PACU RN and Post -op Vital signs reviewed and stable  Post vital signs: Reviewed and stable  Complications: No apparent anesthesia complications

## 2011-07-22 NOTE — Progress Notes (Signed)
She is resting well.  Vital signs stable.  Family in room.  Pain has been controlled.  Will try PT tomorrow.

## 2011-07-22 NOTE — Progress Notes (Signed)
Physical Therapy Evaluation Patient Details Name: HADLEE BURBACK MRN: [id number] DOB: [birth year] Today's Date: 07/22/2011  Problem List:  Patient Active Problem List  Diagnoses  . Fall  . Physical deconditioning  . Humerus fracture  . Alzheimer disease  . Anxiety disorder  . Glaucoma  . DM type 2 (diabetes mellitus, type 2)  . Delirium, in, senile dementia  . Hip fracture, left    Past Medical History:  Past Medical History  Diagnosis Date  . IBS (irritable bowel syndrome)   . HTN (hypertension)   . Osteoporosis   . Hyperlipidemia   . Hyperkalemia   . Diabetes mellitus   . Alzheimer disease   . Arthritis    Past Surgical History:  Past Surgical History  Procedure Date  . Abdominal hysterectomy   . Cardiac catheterization     PT Assessment/Plan/Recommendation  Pt will need SNF placment. Pt is a 78 yo female who resided at an assistive living facility.  Approximately three weeks ago she fell and broke her L wrist.  She was seen at [facility] and discharged to SNF.  She has now fallen at the SNF and fractured her left hip.  She is to receive therapy with [facility].   She will need a platform walker on her L for ambulation.  Progress will be expected to be slow with her history of Alzheimer disease.  She will be seen 5 times a week in the acute care setting. PT Goals  Improve bed mobilityl  PT Evaluation Precautions/Restrictions  Restrictions Weight Bearing Restrictions: Yes LUE Weight Bearing: Non weight bearing LLE Weight Bearing: Touchdown weight bearing Prior Functioning  Home Living Lives With:  (assistive living but had been at 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

## 2011-07-22 NOTE — Anesthesia Postprocedure Evaluation (Signed)
  Anesthesia Post-op Note  Patient: Michelle Sweeney  Procedure(s) Performed:  ARTHROPLASTY BIPOLAR HIP  Patient Location: PACU  Anesthesia Type: Spinal  Level of Consciousness: awake and patient cooperative  Airway and Oxygen Therapy: Patient Spontanous Breathing and Patient connected to nasal cannula oxygen  Post-op Pain: none  Post-op Assessment: Post-op Vital signs reviewed, Patient's Cardiovascular Status Stable, Respiratory Function Stable, Patent Airway and No signs of Nausea or vomiting  Post-op Vital Signs: Reviewed and stable  Complications: No apparent anesthesia complications

## 2011-07-22 NOTE — Anesthesia Preprocedure Evaluation (Addendum)
Anesthesia Evaluation  Patient identified by MRN, date of birth, ID band  Reviewed: Allergy & Precautions, H&P , NPO status , Patient's Chart, lab work & pertinent test results  Airway Mallampati: II      Dental  (+) Edentulous Upper and Edentulous Lower   Pulmonary  clear to auscultation        Cardiovascular hypertension, Pt. on medications Regular Normal    Neuro/Psych PSYCHIATRIC DISORDERS (Alzheimer's dementia)    GI/Hepatic   Endo/Other  Diabetes mellitus-, Well Controlled, Type 2, Insulin Dependent  Renal/GU      Musculoskeletal   Abdominal   Peds  Hematology   Anesthesia Other Findings   Reproductive/Obstetrics                          Anesthesia Physical Anesthesia Plan  ASA: III  Anesthesia Plan: Spinal   Post-op Pain Management:    Induction: Intravenous  Airway Management Planned: Nasal Cannula  Additional Equipment:   Intra-op Plan:   Post-operative Plan:   Informed Consent: I have reviewed the patients History and Physical, chart, labs and discussed the procedure including the risks, benefits and alternatives for the proposed anesthesia with the patient or authorized representative who has indicated his/her understanding and acceptance.     Plan Discussed with:   Anesthesia Plan Comments:         Anesthesia Quick Evaluation

## 2011-07-22 NOTE — Progress Notes (Signed)
Subjective: This lady had left hip surgery this morning with Dr. Hilda Lias. She seems to tolerate the procedure very well under spinal anesthesia. She has no specific complaints apart from feeling somewhat cold.           Physical Exam: Blood pressure 118/43, pulse 90, temperature 98.5 F (36.9 C), temperature source Oral, resp. rate 16, height 5\' 6"  (1.676 m), weight 67.586 kg (149 lb), SpO2 97.00%. She looks systemically well and appears to be calm and not agitated. She does not speak much. Heart sounds are present and normal. Lung fields are clear. She is alert.   Investigations:  Recent Results (from the past 240 hour(s))  MRSA PCR SCREENING     Status: Normal   Collection Time   07/21/11  3:20 AM      Component Value Range Status Comment   MRSA by PCR NEGATIVE  NEGATIVE  Final      Basic Metabolic Panel:  Basename 07/22/11 0536 07/21/11 0625  NA 137 139  K 3.7 3.4*  CL 106 108  CO2 25 24  GLUCOSE 173* 193*  BUN 8 10  CREATININE 0.36* 0.44*  CALCIUM 9.0 8.9  MG -- --  PHOS -- --   Liver Function Tests:  [facility] 07/22/11 0536 07/20/11 1945  AST 14 23  ALT 12 20  ALKPHOS 137* 192*  BILITOT 0.6 0.6  PROT 5.9* 7.1  ALBUMIN 2.5* 3.2*     CBC:  Basename 07/22/11 1018 07/22/11 0536 07/21/11 1558 07/21/11 0625  WBC -- 7.8 7.9 --  NEUTROABS -- -- -- 6.4  HGB 9.5* 10.4* -- --  HCT 28.5* 30.9* -- --  MCV -- 95.4 94.9 --  PLT -- 401* 433* --    Dg Chest 1 View  07/20/2011  *RADIOLOGY REPORT*  Clinical Data: Fall, question hip fracture  CHEST - 1 VIEW  Comparison: Chest radiograph 06/20/2011  Findings: Stable cardiac silhouette.  No effusion, infiltrate, pneumothorax. There is a fracture of the left humeral head which is an impaction type fracture.  Difficult to tell the chronicity of this fracture.  IMPRESSION:  Fracture of the left proximal humerus.  Original Report Authenticated By: Genevive Bi, M.D.   Dg Hip Complete Left  07/20/2011  *RADIOLOGY REPORT*   Clinical Data: Left hip pain  LEFT HIP - COMPLETE 2+ VIEW  Comparison: Plain films 07/06/2011 the  Findings: There is a sub capital fracture of the left femoral neck with varus angulation.  Hip is located.  IMPRESSION: Subcapital left femoral neck fracture.  Original Report Authenticated By: Genevive Bi, M.D.   Ct Head Wo Contrast  07/20/2011  *RADIOLOGY REPORT*  Clinical Data:  Hip pain, fall, the  CT HEAD WITHOUT CONTRAST CT CERVICAL SPINE WITHOUT CONTRAST  Technique:  Multidetector CT imaging of the head and cervical spine was performed following the standard protocol without intravenous contrast.  Multiplanar CT image reconstructions of the cervical spine were also generated.  Comparison:  Head CT 06/30/2011  CT HEAD  Findings: There is a small scalp hematoma over the lateral left frontal bone.  There is no evidence of intracranial hemorrhage.  No focal mass lesion.  No CT evidence of acute infarction.  There is generalized cortical atrophy.  Periventricular white matter hypodensities are present.  There is no evidence skull fracture.  No fluid the paranasal sinuses or mastoid air cells.  IMPRESSION:  1.  Small left scalp hematoma. 2.  No evidence of intracranial trauma. 3.  Atrophy and microvascular disease.  CT CERVICAL  SPINE  Findings: There is no prevertebral soft tissue swelling.  Normal alignment cervical pain bodies.  There is endplate osteophytosis and loss of disc space in the lower cervical spine.  Normal cervical facet articulation.  Normal craniocervical junction.  No evidence epidural paraspinal hematoma.  IMPRESSION:  1.  No evidence of cervical spine fracture. 2.  Multilevel spondylosis.  Original Report Authenticated By: Genevive Bi, M.D.   Ct Cervical Spine Wo Contrast  07/20/2011  *RADIOLOGY REPORT*  Clinical Data:  Hip pain, fall, the  CT HEAD WITHOUT CONTRAST CT CERVICAL SPINE WITHOUT CONTRAST  Technique:  Multidetector CT imaging of the head and cervical spine was performed  following the standard protocol without intravenous contrast.  Multiplanar CT image reconstructions of the cervical spine were also generated.  Comparison:  Head CT 06/30/2011  CT HEAD  Findings: There is a small scalp hematoma over the lateral left frontal bone.  There is no evidence of intracranial hemorrhage.  No focal mass lesion.  No CT evidence of acute infarction.  There is generalized cortical atrophy.  Periventricular white matter hypodensities are present.  There is no evidence skull fracture.  No fluid the paranasal sinuses or mastoid air cells.  IMPRESSION:  1.  Small left scalp hematoma. 2.  No evidence of intracranial trauma. 3.  Atrophy and microvascular disease.  CT CERVICAL SPINE  Findings: There is no prevertebral soft tissue swelling.  Normal alignment cervical pain bodies.  There is endplate osteophytosis and loss of disc space in the lower cervical spine.  Normal cervical facet articulation.  Normal craniocervical junction.  No evidence epidural paraspinal hematoma.  IMPRESSION:  1.  No evidence of cervical spine fracture. 2.  Multilevel spondylosis.  Original Report Authenticated By: Genevive Bi, M.D.   Dg Hip Portable 1 View Left  07/22/2011  *RADIOLOGY REPORT*  Clinical Data: Bipolar left hip arthroplasty.  PORTABLE LEFT HIP - 1 VIEW 07/22/2011 0859 hours:  Comparison: Left hip x-rays 07/20/2011.  Findings: Anatomic alignment in the AP projection, post left bipolar hip arthroplasty.  No complicating features.  Surgical drain in place.  IMPRESSION: Anatomic alignment the AP projection, post left bipolar hip arthroplasty, without complicating features.  Original Report Authenticated By: Arnell Sieving, M.D.      Medications: I have reviewed the patient's current medications.  Impression: 1. Left hip fracture it is, status post left hip replacement. Patient is having one unit of blood transfusion currently. 2. Dementia, moderate to severe. 3. Hypertension. 4. Diabetes  mellitus.     Plan: 1. Continue to monitor clinically. 2. Monitor hemoglobin and renal function. 3. Mobilize with physical therapy, likely tomorrow. 4. Disposition: Return to skilled nursing facility for rehabilitation.      LOS: 2 days   Wilson Singer Pager [phone]  07/22/2011, 1:02 PM

## 2011-07-22 NOTE — Progress Notes (Signed)
#  1 unit PRBC"S started and infusing without difficulty.

## 2011-07-22 NOTE — Brief Op Note (Signed)
07/20/2011 - 07/22/2011  9:17 AM  PATIENT:  Michelle Sweeney  78 y.o. female  PRE-OPERATIVE DIAGNOSIS:  Left hip fracture subcapital   POST-OPERATIVE DIAGNOSIS:  Left hip fracture subcapital   PROCEDURE:  Procedure(s): ARTHROPLASTY BIPOLAR HIP left  SURGEON:  Surgeon(s): Sondos Wolfman  PHYSICIAN ASSISTANT:   ASSISTANTS: none   ANESTHESIA:   spinal  EBL:  Total I/O In: 200 [I.V.:200] Out: 250 [Urine:150; Blood:100]  BLOOD ADMINISTERED:none  DRAINS: (large) Hemovact drain(s) in the left hip with  Suction Open   LOCAL MEDICATIONS USED:  NONE  SPECIMEN:  Source of Specimen:  left femoral head  DISPOSITION OF SPECIMEN:  PATHOLOGY  COUNTS:  YES  TOURNIQUET:  * No tourniquets in log *  DICTATION: .Other Dictation: Dictation Number [id number]  PLAN OF CARE: Admit to inpatient   PATIENT DISPOSITION:  PACU - hemodynamically stable.   Delay start of Pharmacological VTE agent (>24hrs) due to surgical blood loss or risk of bleeding: no

## 2011-07-22 NOTE — Progress Notes (Signed)
Dr Gonzalez at bedside to check pt. Cleared for d/c to room. 

## 2011-07-22 NOTE — Anesthesia Procedure Notes (Addendum)
Spinal   Spinal  Patient location during procedure: OR Start time: 07/22/2011 8:01 AM Staffing Anesthesiologist: Laurene Footman CRNA/Resident: ANDRAZA, Fayelynn Distel Performed by: anesthesiologist and resident/CRNA  Preanesthetic Checklist Completed: patient identified, site marked, surgical consent, pre-op evaluation, timeout performed, IV checked, risks and benefits discussed and monitors and equipment checked Spinal Block Patient position: left lateral decubitus Prep: Betadine Patient monitoring: heart rate, cardiac monitor, continuous pulse ox and blood pressure Approach: left paramedian Location: L4-5 Injection technique: single-shot Needle Needle type: Spinocan  Needle length: 9 cm Assessment Sensory level: T8 Additional Notes 0801 Sensorcaine .75% 2cc with Fentanyl 20 mcg injected intrathecally by Dr. Marcos Eke  Lot [id number]  Exp 03/2012

## 2011-07-22 NOTE — Progress Notes (Signed)
Dr Hilda Lias notified of hgb 9.5 and hct 28.7 and 1 unit PRBC's given. No new orders received.

## 2011-07-23 LAB — CBC
Hemoglobin: 10.1 g/dL — ABNORMAL LOW (ref 12.0–15.0)
MCHC: 33.3 g/dL (ref 30.0–36.0)
Platelets: 302 10*3/uL (ref 150–400)
RDW: 16 % — ABNORMAL HIGH (ref 11.5–15.5)

## 2011-07-23 LAB — GLUCOSE, CAPILLARY: Glucose-Capillary: 159 mg/dL — ABNORMAL HIGH (ref 70–99)

## 2011-07-23 LAB — BASIC METABOLIC PANEL
BUN: 11 mg/dL (ref 6–23)
GFR calc Af Amer: >90 mL/min (ref >90)
GFR calc non Af Amer: >90 mL/min (ref >90)
Potassium: 4.1 mEq/L (ref 3.5–5.1)

## 2011-07-23 LAB — DIFFERENTIAL
Basophils Absolute: 0 10*3/uL (ref 0.0–0.1)
Basophils Relative: 0 % (ref 0–1)
Monocytes Relative: 10 % (ref 3–12)
Neutro Abs: 6.4 10*3/uL (ref 1.7–7.7)
Neutrophils Relative %: 80 % — ABNORMAL HIGH (ref 43–77)

## 2011-07-23 MED ORDER — SODIUM CHLORIDE 0.9 % IJ SOLN
INTRAMUSCULAR | Status: AC
  Filled 2011-07-23: qty 3

## 2011-07-23 MED ORDER — ALBUTEROL SULFATE (5 MG/ML) 0.5% IN NEBU: 2.5000 mg | INHALATION_SOLUTION | RESPIRATORY_TRACT | Status: DC | PRN

## 2011-07-23 MED ORDER — OXYCODONE HCL 5 MG PO TABS
5.0000 mg | ORAL_TABLET | ORAL | Status: DC | PRN
  Administered 2011-07-23 – 2011-07-26 (×4): 5 mg via ORAL
  Filled 2011-07-23 (×4): qty 1

## 2011-07-23 MED ORDER — TRAMADOL HCL 50 MG PO TABS
50.0000 mg | ORAL_TABLET | Freq: Three times a day (TID) | ORAL | Status: DC
  Administered 2011-07-23 – 2011-07-25 (×6): 50 mg via ORAL
  Filled 2011-07-23 (×6): qty 1

## 2011-07-23 MED ORDER — TRAMADOL HCL 50 MG PO TABS
50.0000 mg | ORAL_TABLET | Freq: Once | ORAL | Status: AC
  Administered 2011-07-23: 50 mg via ORAL
  Filled 2011-07-23: qty 1

## 2011-07-23 NOTE — Progress Notes (Signed)
Physical Therapy Treatment Patient Details Name: Michelle Sweeney MRN: [id number] DOB: 08/19/1932 Today's Date: 07/23/2011  TIME: 915-959/ TA- 15 mins TE  PT Assessment/Plan  PT - Assessment/Plan Comments on Treatment Session: Pt completed bed exercises with AAROM with little complaint of pain; Pt was MOD A for sit <>stand x8 with arms on PTA's shoulders and MAX A for pivot transfer to chair; Platform RW use attempted, however pt had bowel movement and needed to be cleaned and was too fatigued from sit <>stands so standing pivot to chair was performed; will attempt gait with platform next sesssion PT Goals  Acute Rehab PT Goals PT Goal: Supine/Side to Sit - Progress: Progressing toward goal PT Goal: Sit to Supine/Side - Progress: Progressing toward goal PT Goal: Sit to Stand - Progress: Progressing toward goal PT Goal: Stand to Sit - Progress: Progressing toward goal PT Goal: Ambulate - Progress: Progressing toward goal  PT Treatment Precautions/Restrictions  Restrictions Weight Bearing Restrictions: Yes LUE Weight Bearing: Non weight bearing LLE Weight Bearing: Touchdown weight bearing Mobility (including Balance) Bed Mobility Supine to Sit: 2: Max assist Supine to Sit Details (indicate cue type and reason): assistance needed with LLE and trunk rotation Sitting - Scoot to Edge of Bed: 2: Max assist Sitting - Scoot to [place] of Bed Details (indicate cue type and reason): pt able to weight shift;advance hips with mat Transfers Transfers: Yes Sit to Stand: 3: Mod assist Sit to Stand Details (indicate cue type and reason): verbal cues for hand placement and TTWB LLE Stand to Sit: 3: Mod assist Stand to Sit Details: assistance with controlling descent and verbal cues to reach back Stand Pivot Transfers: 2: Max Actuary Details (indicate cue type and reason): bed<>recliner Ambulation/Gait Ambulation/Gait: No Stairs: No Wheelchair Mobility Wheelchair Mobility:  No    Exercise  General Exercises - Upper Extremity Shoulder Flexion: Both;10 reps Shoulder Horizontal ABduction: Both;10 reps General Exercises - Lower Extremity Ankle Circles/Pumps: Both;15 reps Quad Sets: Both;10 reps Gluteal Sets: 10 reps Short Arc Quad: Both;10 reps;AAROM Heel Slides: AAROM;Both;10 reps Hip ABduction/ADduction: AAROM;Both;10 reps Other Exercises Other Exercises: sit <>stand x8;to facilitate TTWB on LLe and for ADL's of bathing End of Session PT - End of Session Equipment Utilized During Treatment: Gait belt Activity Tolerance: Patient tolerated treatment well Patient left: in chair;with family/visitor present (nursing present) Nurse Communication: Mobility status for transfers General Behavior During Session: [facility] for tasks performed Cognition: [facility] for tasks performed  Younis Mathey ATKINSO 07/23/2011, 10:27 AM

## 2011-07-23 NOTE — Anesthesia Postprocedure Evaluation (Signed)
  Anesthesia Post-op Note  Patient: Michelle Sweeney  Procedure(s) Performed:  ARTHROPLASTY BIPOLAR HIP  Patient Location:Room 322  Anesthesia Type: Spinal  Level of Consciousness: awake, alert , patient cooperative and confused  Airway and Oxygen Therapy: Patient Spontanous Breathing  Post-op Pain: mild  Post-op Assessment: Post-op Vital signs reviewed, Patient's Cardiovascular Status Stable, Respiratory Function Stable, Patent Airway, No signs of Nausea or vomiting, Adequate PO intake and Pain level controlled  Post-op Vital Signs: Reviewed and stable  Complications: No apparent anesthesia complications

## 2011-07-23 NOTE — Progress Notes (Signed)
Subjective: 1 Day Post-Op Procedure(s) (LRB): ARTHROPLASTY BIPOLAR HIP (Left) Patient reports pain as 2 on 0-10 scale.    Objective: Vital signs in last 24 hours: Temp:  [97.2 F (36.2 C)-98.8 F (37.1 C)] 97.5 F (36.4 C) (01/10 0530) Pulse Rate:  [65-116] 81  (01/10 0530) Resp:  [5-20] 20  (01/10 0530) BP: (99-127)/(43-72) 121/67 mmHg (01/10 0530) SpO2:  [92 %-100 %] 96 % (01/10 0701) Weight:  [66.6 kg (146 lb 13.2 oz)] 66.6 kg (146 lb 13.2 oz) (01/10 0530)  Intake/Output from previous day: 01/09 0701 - 01/10 0700 In: 1680 [P.O.:480; I.V.:1200] Out: 870 [Urine:650; Drains:120; Blood:100] Intake/Output this shift:     Basename 07/23/11 0558 07/22/11 1018 07/22/11 0536 07/21/11 1558 07/21/11 0625  HGB 10.1* 9.5* 10.4* 10.1* 10.3*    Basename 07/23/11 0558 07/22/11 1018 07/22/11 0536  WBC 8.0 -- 7.8  RBC 3.19* -- 3.24*  HCT 30.3* 28.5* --  PLT 302 -- 401*    Basename 07/23/11 0558 07/22/11 0536  NA 136 137  K 4.1 3.7  CL 104 106  CO2 27 25  BUN 11 8  CREATININE 0.43* 0.36*  GLUCOSE 199* 173*  CALCIUM 9.0 9.0    Basename 07/21/11 0625  LABPT --  INR 1.10    Neurovascular intact Sensation intact distally Intact pulses distally Dorsiflexion/Plantar flexion intact  She had a good night last night.  She has pain controlled.  Will begin PT today.  Assessment/Plan: 1 Day Post-Op Procedure(s) (LRB): ARTHROPLASTY BIPOLAR HIP (Left) Up with therapy  Michelle Sweeney 07/23/2011, 8:33 AM

## 2011-07-23 NOTE — Op Note (Signed)
NAME:  Michelle Sweeney, Michelle Sweeney                ACCOUNT NO.:  [id number]  MEDICAL RECORD NO.:  [id number]  LOCATION:  A322                          FACILITY:  [facility]  PHYSICIAN:  J. Darreld Sweeney, M.D. DATE OF BIRTH:  01-Aug-1932  DATE OF PROCEDURE: DATE OF DISCHARGE:                              OPERATIVE REPORT   PREOPERATIVE DIAGNOSIS:  Subcapital fracture of the left hip.  POSTOPERATIVE DIAGNOSIS:  Subcapital fracture of the left hip.  PROCEDURE:  Left bipolar hip prostheses using a [employer] #12 stem, 44 bipolar shell, and a 28+ 0 head.  ANESTHESIA:  Spinal.  ESTIMATED BLOOD LOSS:  Less than 100 mL.  DRAIN:  One large Hemovac drain.  SURGEON:  J. Darreld Mclean, MD  No blood given.  INDICATIONS:  The patient is a 78 year old female with severe Alzheimer disorientation and dementia who fell at a local nursing home and injured her left hip.  Before and after Christmas, she has had a fracture of the left proximal humerus, her left distal humerus, and her left wrist.  She is in a long-arm cast.  I have gone over the need for surgery with the patient's daughter and that the patient's markedly increased risk secondary to severe disorientation and recurring falls.  I went over the risks and imponderables including infection, dislocation, embolization which could lead to death, blood transfusion, possibility, need for intense physical therapy, and anesthesia risk.  I recommended spinal anesthesia.  Biggest concern is the patient is disoriented and has fallen so many times, this could happen again.  The daughter asked appropriate questions and agreed to the procedure as outlined.  DESCRIPTION OF PROCEDURE:  The patient was seen in the holding area and left hip was provided as a correct surgical site.  Michelle Sweeney was placed on the left hip.  She was brought to the operating room, given spinal anesthesia, and placed left side up, right side down, decubitus held in place by supports.  She  has prepped and draped in the usual manner.  We had a generalized time-out.  We identified the patient as Michelle Sweeney. We were doing the left hip for bipolar hip prosthesis.  All instrumentations had been properly working and position.  Operating room team knew each other.  Posterior approach was made to the hip.  Short external rotator was identified, tagged and cut.  A Penrose drain has been placed around the sciatic nerve.  Hip capsule was opened.  Culture was obtained.  Femoral head was removed with a corkscrew and measured at 44 mm.  Femoral neck was prepared and then it was reamed up to a size 13 and rasped up to a size 12, 12 fit very nicely.  Femoral neck was cut appropriately.  Trial reduction carried out with a #12 stem, 0 neck and a 44 head and fit nicely and leg lengths appear to be equal.  Trials were removed from her prosthesis, inserted a #12 stem and #0 neck and a 28 inner head and a 44 outer head.  Reduction was carried out.  It looked very nice, fit nicely, and was stable.  X-rays were taken, showed good position and alignment.  Short external rotators were  reapproximated through previously applied tags.  Penrose drain was removed from the sciatic nerve.  There was no apparent injury.  Hemovac drain was placed, sewn in with 2-0 silk suture.  Fascia layer reapproximated using interrupted figure-of-eight #1 Surgilon suture. Subcutaneous tissue was closed in layers using 2-0 plain and skin staples were used.  Sterile dressing was applied.  The patient have an abduction pillow between her knees.  She will go to recovery in good condition.          ______________________________ Michelle Sweeney, M.D.     JWK/MEDQ  D:  07/22/2011  T:  07/23/2011  Job:  [id number]

## 2011-07-23 NOTE — Progress Notes (Signed)
Inpatient Diabetes Program Recommendations  AACE/ADA: New Consensus Statement on Inpatient Glycemic Control (2009)  Target Ranges:  Prepandial:   less than 140 mg/dL      Peak postprandial:   less than 180 mg/dL (1-2 hours)      Critically ill patients:  140 - 180 mg/dL   Reason for Visit: History of Diabetes and Lab Glucose 199 mg/dL  Inpatient Diabetes Program Recommendations Correction (SSI): Add Novolog Correction Diet: Add CHO Modified Medium to diet

## 2011-07-23 NOTE — Progress Notes (Signed)
Subjective: This lady has done well following left hip surgery yesterday. She is alert and she has a good appetite. She has a minimal cough but no dyspnea. She is really not required much in the way of the PCA pump.           Physical Exam: Blood pressure 121/67, pulse 81, temperature 97.5 F (36.4 C), temperature source Oral, resp. rate 20, height 5\' 6"  (1.676 m), weight 66.6 kg (146 lb 13.2 oz), SpO2 96.00%. She looks systemically well and appears to be calm and not agitated. She does not speak much. Heart sounds are present and normal. Lung fields are clear. She is alert.   Investigations:  Recent Results (from the past 240 hour(s))  MRSA PCR SCREENING     Status: Normal   Collection Time   07/21/11  3:20 AM      Component Value Range Status Comment   MRSA by PCR NEGATIVE  NEGATIVE  Final      Basic Metabolic Panel:  Basename 07/23/11 0558 07/22/11 0536  NA 136 137  K 4.1 3.7  CL 104 106  CO2 27 25  GLUCOSE 199* 173*  BUN 11 8  CREATININE 0.43* 0.36*  CALCIUM 9.0 9.0  MG -- --  PHOS -- --   Liver Function Tests:  [facility] 07/22/11 0536 07/20/11 1945  AST 14 23  ALT 12 20  ALKPHOS 137* 192*  BILITOT 0.6 0.6  PROT 5.9* 7.1  ALBUMIN 2.5* 3.2*     CBC:  Basename 07/23/11 0558 07/22/11 1018 07/22/11 0536 07/21/11 0625  WBC 8.0 -- 7.8 --  NEUTROABS 6.4 -- -- 6.4  HGB 10.1* 9.5* -- --  HCT 30.3* 28.5* -- --  MCV 95.0 -- 95.4 --  PLT 302 -- 401* --    Dg Hip Portable 1 View Left  07/22/2011  *RADIOLOGY REPORT*  Clinical Data: Bipolar left hip arthroplasty.  PORTABLE LEFT HIP - 1 VIEW 07/22/2011 0859 hours:  Comparison: Left hip x-rays 07/20/2011.  Findings: Anatomic alignment in the AP projection, post left bipolar hip arthroplasty.  No complicating features.  Surgical drain in place.  IMPRESSION: Anatomic alignment the AP projection, post left bipolar hip arthroplasty, without complicating features.  Original Report Authenticated By: Arnell Sieving, M.D.       Medications: I have reviewed the patient's current medications.  Impression: 1. Left hip fracture it is, status post left hip replacement.  2. Dementia, moderate to severe. 3. Hypertension. 4. Diabetes mellitus. 5. Anemia related to blood loss, status post 1 unit blood transfusion yesterday. Hemoglobin stable now.     Plan: 1. Discontinue PCA morphine pump. Continue with oral oxycodone when necessary or intravenous morphine when necessary as required. 2. Encourage physical therapy. 3. Disposition-she will require skilled nursing facility for further rehabilitation.      LOS: 3 days   Wilson Singer Pager [phone]  07/23/2011, 8:28 AM

## 2011-07-23 NOTE — Addendum Note (Signed)
Addendum  created 07/23/11 1456 by Corena Pilgrim, CRNA   Modules edited:Notes Section

## 2011-07-24 LAB — BASIC METABOLIC PANEL
CO2: 24 mEq/L (ref 19–32)
Calcium: 8.5 mg/dL (ref 8.4–10.5)
Sodium: 136 mEq/L (ref 135–145)

## 2011-07-24 LAB — CBC
MCV: 93.3 fL (ref 78.0–100.0)
Platelets: 301 10*3/uL (ref 150–400)
RBC: 2.99 MIL/uL — ABNORMAL LOW (ref 3.87–5.11)
WBC: 5.7 10*3/uL (ref 4.0–10.5)

## 2011-07-24 LAB — DIFFERENTIAL
Lymphocytes Relative: 15 % (ref 12–46)
Lymphs Abs: 0.8 10*3/uL (ref 0.7–4.0)
Neutro Abs: 4 10*3/uL (ref 1.7–7.7)
Neutrophils Relative %: 71 % (ref 43–77)

## 2011-07-24 MED ORDER — ENOXAPARIN SODIUM 40 MG/0.4ML ~~LOC~~ SOLN
40.0000 mg | Freq: Every day | SUBCUTANEOUS | Status: DC
  Administered 2011-07-25 – 2011-07-27 (×3): 40 mg via SUBCUTANEOUS
  Filled 2011-07-24 (×3): qty 0.4

## 2011-07-24 MED ORDER — SODIUM CHLORIDE 0.9 % IJ SOLN
INTRAMUSCULAR | Status: AC
  Administered 2011-07-24: 3 mL
  Filled 2011-07-24: qty 3

## 2011-07-24 NOTE — Progress Notes (Signed)
Subjective: 2 Days Post-Op Procedure(s) (LRB): ARTHROPLASTY BIPOLAR HIP (Left) Patient reports pain as 2 on 0-10 scale.    Objective: Vital signs in last 24 hours: Temp:  [97.8 F (36.6 C)-101.6 F (38.7 C)] 98.2 F (36.8 C) (01/11 0622) Pulse Rate:  [79-93] 79  (01/11 0622) Resp:  [20] 20  (01/11 0622) BP: (100-128)/(55-72) 128/72 mmHg (01/11 0622) SpO2:  [93 %-100 %] 100 % (01/11 0622) Weight:  [65.6 kg (144 lb 10 oz)] 65.6 kg (144 lb 10 oz) (01/11 0436)  Intake/Output from previous day: 01/10 0701 - 01/11 0700 In: 360 [P.O.:360] Out: 390 [Urine:340; Drains:50] Intake/Output this shift:     Basename 07/24/11 0503 07/23/11 0558 07/22/11 1018 07/22/11 0536 07/21/11 1558  HGB 9.3* 10.1* 9.5* 10.4* 10.1*    Basename 07/24/11 0503 07/23/11 0558  WBC 5.7 8.0  RBC 2.99* 3.19*  HCT 27.9* 30.3*  PLT 301 302    Basename 07/24/11 0503 07/23/11 0558  NA 136 136  K 3.4* 4.1  CL 105 104  CO2 24 27  BUN 15 11  CREATININE 0.37* 0.43*  GLUCOSE 160* 199*  CALCIUM 8.5 9.0   No results found for this basename: LABPT:2,INR:2 in the last 72 hours  Neurovascular intact Sensation intact distally Intact pulses distally Dorsiflexion/Plantar flexion intact Incision: dressing C/D/I  Her hemovac was removed.  She will have the foley taken out this am.  Continue PT.  She did have a spike of temp to 101.6 but now normal.  Hgb is 9.3, to watch.  Assessment/Plan: 2 Days Post-Op Procedure(s) (LRB): ARTHROPLASTY BIPOLAR HIP (Left) Up with therapy  Rip Hawes 07/24/2011, 7:23 AM

## 2011-07-24 NOTE — Progress Notes (Signed)
Subjective: This lady is doing fairly well. She did have a fever yesterday but has no other source of this apart from post operative inflammation. She has no cough, or chest congestion, dyspnea.          Physical Exam: Blood pressure 128/72, pulse 79, temperature 98.2 F (36.8 C), temperature source Oral, resp. rate 20, height 5\' 6"  (1.676 m), weight 65.6 kg (144 lb 10 oz), SpO2 100.00%. She looks systemically well and appears to be calm and not agitated. She does not speak much. Heart sounds are present and normal. Lung fields are clear. She is alert.   Investigations:  Recent Results (from the past 240 hour(s))  MRSA PCR SCREENING     Status: Normal   Collection Time   07/21/11  3:20 AM      Component Value Range Status Comment   MRSA by PCR NEGATIVE  NEGATIVE  Final   WOUND CULTURE     Status: Normal (Preliminary result)   Collection Time   07/22/11  8:42 AM      Component Value Range Status Comment   Specimen Description WOUND LEFT HIP JOINT   Final    Special Requests ANCEF 1gm AT 0818 07/22/2011   Final    Gram Stain     Final    Value: NO WBC SEEN     NO SQUAMOUS EPITHELIAL CELLS SEEN     NO ORGANISMS SEEN   Culture PENDING   Incomplete    Report Status PENDING   Incomplete      Basic Metabolic Panel:  Basename 07/24/11 0503 07/23/11 0558  NA 136 136  K 3.4* 4.1  CL 105 104  CO2 24 27  GLUCOSE 160* 199*  BUN 15 11  CREATININE 0.37* 0.43*  CALCIUM 8.5 9.0  MG -- --  PHOS -- --   Liver Function Tests:  [facility] 07/22/11 0536  AST 14  ALT 12  ALKPHOS 137*  BILITOT 0.6  PROT 5.9*  ALBUMIN 2.5*     CBC:  Basename 07/24/11 0503 07/23/11 0558  WBC 5.7 8.0  NEUTROABS 4.0 6.4  HGB 9.3* 10.1*  HCT 27.9* 30.3*  MCV 93.3 95.0  PLT 301 302    Dg Hip Portable 1 View Left  07/22/2011  *RADIOLOGY REPORT*  Clinical Data: Bipolar left hip arthroplasty.  PORTABLE LEFT HIP - 1 VIEW 07/22/2011 0859 hours:  Comparison: Left hip x-rays 07/20/2011.  Findings:  Anatomic alignment in the AP projection, post left bipolar hip arthroplasty.  No complicating features.  Surgical drain in place.  IMPRESSION: Anatomic alignment the AP projection, post left bipolar hip arthroplasty, without complicating features.  Original Report Authenticated By: Arnell Sieving, M.D.      Medications: I have reviewed the patient's current medications.  Impression: 1. Left hip fracture it is, status post left hip replacement.  2. Dementia, moderate to severe. 3. Hypertension. 4. Diabetes mellitus. 5. Anemia related to blood loss, status post 1 unit blood transfusion yesterday. Hemoglobin stable now.     Plan: 1. She has been started on scheduled tramadol for analgesia. She has not required any significant breakthrough intravenous morphine. 2. Physical therapy today and hopefully weight-bearing. 3. Disposition-she will need rehabilitation at a skilled nursing facility.      LOS: 4 days   Wilson Singer Pager [phone]  07/24/2011, 8:01 AM

## 2011-07-24 NOTE — Progress Notes (Signed)
Physical Therapy Treatment Patient Details Name: MAEZIE JUSTIN MRN: [id number] DOB: 25-Mar-1933 Today's Date: 07/24/2011  PT Assessment/Plan  PT - Assessment/Plan Comments on Treatment Session: tolerated PT well with no significant c/o pain...unable to use platform walker as it is too tall for her and we do not have a Junior size walker PT Plan: Discharge plan remains appropriate;Frequency remains appropriate Equipment Recommended: Defer to next venue PT Goals  Acute Rehab PT Goals PT Goal: Supine/Side to Sit - Progress: Progressing toward goal PT Goal: Sit to Supine/Side - Progress: Progressing toward goal PT Goal: Sit to Stand - Progress: Progressing toward goal PT Goal: Stand to Sit - Progress: Progressing toward goal PT Goal: Ambulate - Progress: Progressing toward goal  PT Treatment Precautions/Restrictions  Restrictions Weight Bearing Restrictions: Yes LUE Weight Bearing: Non weight bearing LLE Weight Bearing: Touchdown weight bearing Mobility (including Balance) Bed Mobility Supine to Sit: 2: Max assist Sitting - Scoot to [place] of Bed: 2: Max assist Transfers Sit to Stand: 3: Mod assist Sit to Stand Details (indicate cue type and reason): attempted to stand to L platform walker,but walker height is too high for pt and cannot be lowered...she will need a junior size walker with L platform Stand to Sit: 4: Min assist Stand Pivot Transfers: 3: Mod assist Ambulation/Gait Ambulation/Gait: No    Exercise  Total Joint Exercises Ankle Circles/Pumps: AROM;Both;10 reps;Supine Quad Sets: AROM;Both;10 reps;Supine Gluteal Sets: AROM;Both;10 reps;Supine Short Arc Quad: AAROM;Both;10 reps;Supine Heel Slides: AAROM;Both;10 reps;Supine Hip ABduction/ADduction: AAROM;Both;10 reps;Supine End of Session PT - End of Session Equipment Utilized During Treatment: Gait belt Activity Tolerance: Patient tolerated treatment well Patient left: in chair;with call bell in reach;with  family/visitor present General Behavior During Session: [facility] for tasks performed Cognition: [facility] for tasks performed  Konrad Penta 07/24/2011, 10:15 AM

## 2011-07-24 NOTE — Progress Notes (Signed)
CSW spoke with Tammi at [facility] who reports bed is available at [facility] on Saturday or Monday for whenever pt is stable for d/c.  Karn Cassis

## 2011-07-25 LAB — CBC
Hemoglobin: 9.9 g/dL — ABNORMAL LOW (ref 12.0–15.0)
MCH: 31.7 pg (ref 26.0–34.0)
MCV: 92.3 fL (ref 78.0–100.0)
RBC: 3.12 MIL/uL — ABNORMAL LOW (ref 3.87–5.11)

## 2011-07-25 LAB — BASIC METABOLIC PANEL
CO2: 28 mEq/L (ref 19–32)
Chloride: 101 mEq/L (ref 96–112)
Glucose, Bld: 162 mg/dL — ABNORMAL HIGH (ref 70–99)
Potassium: 3.2 mEq/L — ABNORMAL LOW (ref 3.5–5.1)
Sodium: 133 mEq/L — ABNORMAL LOW (ref 135–145)

## 2011-07-25 LAB — DIFFERENTIAL
Eosinophils Absolute: 0.2 10*3/uL (ref 0.0–0.7)
Eosinophils Relative: 3 % (ref 0–5)
Lymphs Abs: 0.9 10*3/uL (ref 0.7–4.0)
Monocytes Relative: 11 % (ref 3–12)

## 2011-07-25 MED ORDER — TRAMADOL HCL 50 MG PO TABS
50.0000 mg | ORAL_TABLET | Freq: Four times a day (QID) | ORAL | Status: DC | PRN
  Administered 2011-07-26 – 2011-07-27 (×2): 50 mg via ORAL
  Filled 2011-07-25 (×2): qty 1

## 2011-07-25 MED ORDER — POTASSIUM CHLORIDE CRYS ER 20 MEQ PO TBCR
40.0000 meq | EXTENDED_RELEASE_TABLET | ORAL | Status: AC
  Administered 2011-07-25 (×2): 40 meq via ORAL
  Filled 2011-07-25: qty 2

## 2011-07-25 MED ORDER — POTASSIUM CHLORIDE CRYS ER 20 MEQ PO TBCR
40.0000 meq | EXTENDED_RELEASE_TABLET | Freq: Two times a day (BID) | ORAL | Status: DC
  Administered 2011-07-25 – 2011-07-27 (×5): 40 meq via ORAL
  Filled 2011-07-25 (×5): qty 2
  Filled 2011-07-25: qty 1

## 2011-07-25 NOTE — Progress Notes (Signed)
Patient had a fever tonight at 100.5. Patient's WBC also had gone down from 8 to 5.6. Patient was sweating profusely. Doctor was notified for advise about whether to be concerned. Doctor was made aware that patient had a fever the night before as well. No new orders were given. Will continue to monitor.

## 2011-07-25 NOTE — Progress Notes (Signed)
Subjective: Patient is resting in chair.  Daughter reports that she has been sleeping more and often appears confused.  She attributes this behavior to the morphine she received.  No cough or shortness of breath.  Objective: Vital signs in last 24 hours: Temp:  [98.3 F (36.8 C)-100.5 F (38.1 C)] 98.3 F (36.8 C) (01/12 0625) Pulse Rate:  [78-107] 78  (01/12 0625) Resp:  [20-24] 20  (01/12 0625) BP: (117-119)/(67-68) 117/67 mmHg (01/12 0625) SpO2:  [96 %-98 %] 98 % (01/12 0625) Weight:  [68.1 kg (150 lb 2.1 oz)] 68.1 kg (150 lb 2.1 oz) (01/12 0625) Weight change: 2.5 kg (5 lb 8.2 oz) Last BM Date: 07/24/11  Intake/Output from previous day: 01/11 0701 - 01/12 0700 In: 320 [P.O.:320] Out: 600 [Urine:600] Total I/O In: 200 [P.O.:200] Out: -    Physical Exam: General: Alert, awake, oriented x3, in no acute distress. HEENT: No bruits, no goiter. Heart: Regular rate and rhythm, without murmurs, rubs, gallops. Lungs: Clear to auscultation bilaterally. Abdomen: Soft, nontender, nondistended, positive bowel sounds. Extremities: No clubbing cyanosis or edema with positive pedal pulses. Neuro: Grossly intact, nonfocal.    Lab Results: Basic Metabolic Panel:  Basename 07/25/11 0606 07/24/11 0503  NA 133* 136  K 3.2* 3.4*  CL 101 105  CO2 28 24  GLUCOSE 162* 160*  BUN 9 15  CREATININE 0.38* 0.37*  CALCIUM 8.7 8.5  MG -- --  PHOS -- --   Liver Function Tests: No results found for this basename: AST:2,ALT:2,ALKPHOS:2,BILITOT:2,PROT:2,ALBUMIN:2 in the last 72 hours No results found for this basename: LIPASE:2,AMYLASE:2 in the last 72 hours No results found for this basename: AMMONIA:2 in the last 72 hours CBC:  Basename 07/25/11 0606 07/24/11 0503  WBC 5.5 5.7  NEUTROABS 3.8 4.0  HGB 9.9* 9.3*  HCT 28.8* 27.9*  MCV 92.3 93.3  PLT 313 301   Cardiac Enzymes: No results found for this basename: CKTOTAL:3,CKMB:3,CKMBINDEX:3,TROPONINI:3 in the last 72 hours BNP: No  results found for this basename: PROBNP:3 in the last 72 hours D-Dimer: No results found for this basename: DDIMER:2 in the last 72 hours CBG: No results found for this basename: GLUCAP:6 in the last 72 hours Hemoglobin A1C: No results found for this basename: HGBA1C in the last 72 hours Fasting Lipid Panel: No results found for this basename: CHOL,HDL,LDLCALC,TRIG,CHOLHDL,LDLDIRECT in the last 72 hours Thyroid Function Tests: No results found for this basename: TSH,T4TOTAL,FREET4,T3FREE,THYROIDAB in the last 72 hours Anemia Panel: No results found for this basename: VITAMINB12,FOLATE,FERRITIN,TIBC,IRON,RETICCTPCT in the last 72 hours Coagulation: No results found for this basename: LABPROT:2,INR:2 in the last 72 hours Urine Drug Screen: Drugs of Abuse  No results found for this basename: labopia, cocainscrnur, labbenz, amphetmu, thcu, labbarb    Alcohol Level: No results found for this basename: ETH:2 in the last 72 hours  Recent Results (from the past 240 hour(s))  MRSA PCR SCREENING     Status: Normal   Collection Time   07/21/11  3:20 AM      Component Value Range Status Comment   MRSA by PCR NEGATIVE  NEGATIVE  Final   WOUND CULTURE     Status: Normal   Collection Time   07/22/11  8:42 AM      Component Value Range Status Comment   Specimen Description WOUND LEFT HIP JOINT   Final    Special Requests ANCEF 1gm AT 0818 07/22/2011   Final    Gram Stain     Final    Value: NO  WBC SEEN     NO SQUAMOUS EPITHELIAL CELLS SEEN     NO ORGANISMS SEEN   Culture NO GROWTH 2 DAYS   Final    Report Status 07/24/2011 FINAL   Final     Studies/Results: No results found.  Medications: Scheduled Meds:   . ALPRAZolam  0.25 mg Oral BID  . aspirin EC  81 mg Oral Q0700  . bimatoprost  1 drop Both Eyes QHS  . ceFAZolin (ANCEF) IV  1 g Intravenous Once  . cholecalciferol  1,000 Units Oral Daily  . citalopram  20 mg Oral Daily  . enoxaparin (LOVENOX) injection  40 mg Subcutaneous Daily    . Flora-Q  1 capsule Oral Q0700  . polyvinyl alcohol  1 drop Right Eye TID  . potassium chloride  40 mEq Oral BID  . rivastigmine  9.5 mg Transdermal Daily  . simvastatin  20 mg Oral q1800  . sodium chloride      . traMADol  50 mg Oral Q8H  . DISCONTD: enoxaparin (LOVENOX) injection  30 mg Subcutaneous Q24H  . DISCONTD: potassium chloride SA  20 mEq Oral BID   Continuous Infusions:  PRN Meds:.acetaminophen, albuterol, diphenhydrAMINE, diphenhydrAMINE, haloperidol, loperamide, magnesium hydroxide, morphine, naloxone, ondansetron (ZOFRAN) IV, ondansetron (ZOFRAN) IV, ondansetron, oxyCODONE, promethazine, sodium chloride  Assessment/Plan:  1. Left hip fracture it is, status post left hip replacement.  2. Dementia, moderate to severe.  3. Hypertension.  4. Diabetes mellitus.  5. Anemia related to blood loss, status post 1 unit blood transfusion. Hemoglobin stable now.  Plan:  Patient did have low grade temp last night, which may just be post op related.  We will check urinalysis especially with her mental status change.  We will have to review her pain regimen as to not over medicate her.  Potassium will be repleted, will check Mg  Plans will be to go to SNF at [facility] on Monday if stable.    LOS: 5 days   Keland Peyton [facility] 07/25/2011, 2:26 PM

## 2011-07-26 DIAGNOSIS — G9340 Encephalopathy, unspecified: Secondary | ICD-10-CM | POA: Diagnosis present

## 2011-07-26 DIAGNOSIS — N390 Urinary tract infection, site not specified: Secondary | ICD-10-CM | POA: Diagnosis not present

## 2011-07-26 LAB — BASIC METABOLIC PANEL
CO2: 25 mEq/L (ref 19–32)
Calcium: 9.1 mg/dL (ref 8.4–10.5)
Chloride: 104 mEq/L (ref 96–112)
Glucose, Bld: 162 mg/dL — ABNORMAL HIGH (ref 70–99)
Sodium: 134 mEq/L — ABNORMAL LOW (ref 135–145)

## 2011-07-26 LAB — GLUCOSE, CAPILLARY
Glucose-Capillary: 184 mg/dL — ABNORMAL HIGH (ref 70–99)
Glucose-Capillary: 177 mg/dL — ABNORMAL HIGH (ref 70–99)
Glucose-Capillary: 169 mg/dL — ABNORMAL HIGH (ref 70–99)

## 2011-07-26 LAB — URINALYSIS, ROUTINE W REFLEX MICROSCOPIC
Glucose, UA: 100 mg/dL — AB
Protein, ur: NEGATIVE mg/dL
pH: 6.5 (ref 5.0–8.0)

## 2011-07-26 LAB — URINE CULTURE: Culture  Setup Time: 201301132118

## 2011-07-26 LAB — POTASSIUM: Potassium: 4.4 mEq/L (ref 3.5–5.1)

## 2011-07-26 LAB — URINE MICROSCOPIC-ADD ON

## 2011-07-26 MED ORDER — HYDROCODONE-ACETAMINOPHEN 5-325 MG PO TABS: 1.0000 | ORAL_TABLET | ORAL | Status: DC | PRN

## 2011-07-26 MED ORDER — ALPRAZOLAM 0.25 MG PO TABS
0.2500 mg | ORAL_TABLET | Freq: Two times a day (BID) | ORAL | Status: DC | PRN
  Administered 2011-07-27: 0.25 mg via ORAL
  Filled 2011-07-26: qty 1

## 2011-07-26 MED ORDER — DEXTROSE 5 % IV SOLN
1.0000 g | INTRAVENOUS | Status: DC
  Administered 2011-07-26 – 2011-07-27 (×2): 1 g via INTRAVENOUS
  Filled 2011-07-26 (×3): qty 10

## 2011-07-26 MED ORDER — INSULIN ASPART 100 UNIT/ML ~~LOC~~ SOLN: 0.0000 [IU] | Freq: Every day | SUBCUTANEOUS | Status: DC

## 2011-07-26 MED ORDER — SODIUM CHLORIDE 0.45 % IV SOLN
INTRAVENOUS | Status: DC
  Administered 2011-07-26 – 2011-07-27 (×2): via INTRAVENOUS

## 2011-07-26 MED ORDER — INSULIN ASPART 100 UNIT/ML ~~LOC~~ SOLN
0.0000 [IU] | Freq: Three times a day (TID) | SUBCUTANEOUS | Status: DC
  Administered 2011-07-26 (×2): 3 [IU] via SUBCUTANEOUS
  Administered 2011-07-27: 5 [IU] via SUBCUTANEOUS
  Administered 2011-07-27: 3 [IU] via SUBCUTANEOUS
  Filled 2011-07-26: qty 3

## 2011-07-26 NOTE — Progress Notes (Signed)
Patient ID: Michelle Sweeney, female   DOB: May 27, 1933, 78 y.o.   MRN: [id number]  Stable post op hip bipolar left hip   Continue PT

## 2011-07-26 NOTE — Progress Notes (Signed)
Subjective: 4 Days Post-Op Procedure(s) (LRB): ARTHROPLASTY BIPOLAR HIP (Left) Patient reports pain as mild.    Objective: Vital signs in last 24 hours: Temp:  [97.5 F (36.4 C)-98.4 F (36.9 C)] 98.4 F (36.9 C) (01/13 0545) Pulse Rate:  [70-84] 70  (01/13 0545) Resp:  [20-24] 20  (01/13 0545) BP: (97-129)/(62-68) 129/68 mmHg (01/13 0545) SpO2:  [92 %-100 %] 100 % (01/13 0545) Weight:  [65.5 kg (144 lb 6.4 oz)] 65.5 kg (144 lb 6.4 oz) (01/13 0545)  Intake/Output from previous day: 01/12 0701 - 01/13 0700 In: 200 [P.O.:200] Out: 175 [Urine:175] Intake/Output this shift:     Basename 07/25/11 0606 07/24/11 0503  HGB 9.9* 9.3*    Basename 07/25/11 0606 07/24/11 0503  WBC 5.5 5.7  RBC 3.12* 2.99*  HCT 28.8* 27.9*  PLT 313 301    Basename 07/26/11 0627 07/26/11 0622 07/25/11 0606  NA -- 134* 133*  K 4.4 4.4 --  CL -- 104 101  CO2 -- 25 28  BUN -- 11 9  CREATININE -- 0.38* 0.38*  GLUCOSE -- 162* 162*  CALCIUM -- 9.1 8.7   No results found for this basename: LABPT:2,INR:2 in the last 72 hours  SEEMS VERY SEDATE ON OXYCONTIN AND XANAX   Assessment/Plan: 4 Days Post-Op Procedure(s) (LRB): ARTHROPLASTY BIPOLAR HIP (Left) Advance diet REDUCE NARCOTICS AND SEDATIVES  CONTINUE PT   Fuller Canada 07/26/2011, 12:33 PM

## 2011-07-26 NOTE — Progress Notes (Signed)
Subjective: Found to be increasingly lethargic, wakes up to voice and follows commands but falls back asleep  Objective: Vital signs in last 24 hours: Temp:  [97.5 F (36.4 C)-98.4 F (36.9 C)] 98 F (36.7 C) (01/13 1417) Pulse Rate:  [70-84] 76  (01/13 1417) Resp:  [19-24] 19  (01/13 1417) BP: (97-129)/(62-69) 105/69 mmHg (01/13 1417) SpO2:  [92 %-100 %] 100 % (01/13 1417) Weight:  [65.5 kg (144 lb 6.4 oz)] 65.5 kg (144 lb 6.4 oz) (01/13 0545) Weight change: -2.6 kg (-5 lb 11.7 oz) Last BM Date: 07/26/11  Intake/Output from previous day: 01/12 0701 - 01/13 0700 In: 200 [P.O.:200] Out: 175 [Urine:175] Total I/O In: 120 [P.O.:120] Out: -    Physical Exam: General: drowsy, awakens to voice, no acute distress HEENT: No bruits, no goiter. Heart: Regular rate and rhythm, without murmurs, rubs, gallops. Lungs: Clear to auscultation bilaterally. Abdomen: Soft, nontender, nondistended, positive bowel sounds. Extremities: No clubbing cyanosis or edema with positive pedal pulses. Neuro: Grossly intact, nonfocal.    Lab Results: Basic Metabolic Panel:  Basename 07/26/11 0627 07/26/11 0622 07/25/11 0606  NA -- 134* 133*  K 4.4 4.4 --  CL -- 104 101  CO2 -- 25 28  GLUCOSE -- 162* 162*  BUN -- 11 9  CREATININE -- 0.38* 0.38*  CALCIUM -- 9.1 8.7  MG -- 2.1 --  PHOS -- -- --   Liver Function Tests: No results found for this basename: AST:2,ALT:2,ALKPHOS:2,BILITOT:2,PROT:2,ALBUMIN:2 in the last 72 hours No results found for this basename: LIPASE:2,AMYLASE:2 in the last 72 hours No results found for this basename: AMMONIA:2 in the last 72 hours CBC:  Basename 07/25/11 0606 07/24/11 0503  WBC 5.5 5.7  NEUTROABS 3.8 4.0  HGB 9.9* 9.3*  HCT 28.8* 27.9*  MCV 92.3 93.3  PLT 313 301   Cardiac Enzymes: No results found for this basename: CKTOTAL:3,CKMB:3,CKMBINDEX:3,TROPONINI:3 in the last 72 hours BNP: No results found for this basename: PROBNP:3 in the last 72  hours D-Dimer: No results found for this basename: DDIMER:2 in the last 72 hours CBG:  Basename 07/26/11 1120  GLUCAP 184*   Hemoglobin A1C: No results found for this basename: HGBA1C in the last 72 hours Fasting Lipid Panel: No results found for this basename: CHOL,HDL,LDLCALC,TRIG,CHOLHDL,LDLDIRECT in the last 72 hours Thyroid Function Tests: No results found for this basename: TSH,T4TOTAL,FREET4,T3FREE,THYROIDAB in the last 72 hours Anemia Panel: No results found for this basename: VITAMINB12,FOLATE,FERRITIN,TIBC,IRON,RETICCTPCT in the last 72 hours Coagulation: No results found for this basename: LABPROT:2,INR:2 in the last 72 hours Urine Drug Screen: Drugs of Abuse  No results found for this basename: labopia, cocainscrnur, labbenz, amphetmu, thcu, labbarb    Alcohol Level: No results found for this basename: ETH:2 in the last 72 hours  Recent Results (from the past 240 hour(s))  MRSA PCR SCREENING     Status: Normal   Collection Time   07/21/11  3:20 AM      Component Value Range Status Comment   MRSA by PCR NEGATIVE  NEGATIVE  Final   WOUND CULTURE     Status: Normal   Collection Time   07/22/11  8:42 AM      Component Value Range Status Comment   Specimen Description WOUND LEFT HIP JOINT   Final    Special Requests ANCEF 1gm AT 0818 07/22/2011   Final    Gram Stain     Final    Value: NO WBC SEEN     NO SQUAMOUS EPITHELIAL CELLS SEEN  NO ORGANISMS SEEN   Culture NO GROWTH 2 DAYS   Final    Report Status 07/24/2011 FINAL   Final     Studies/Results: No results found.  Medications: Scheduled Meds:   . aspirin EC  81 mg Oral Q0700  . bimatoprost  1 drop Both Eyes QHS  . ceFAZolin (ANCEF) IV  1 g Intravenous Once  . cefTRIAXone (ROCEPHIN)  IV  1 g Intravenous Q24H  . cholecalciferol  1,000 Units Oral Daily  . citalopram  20 mg Oral Daily  . enoxaparin (LOVENOX) injection  40 mg Subcutaneous Daily  . Flora-Q  1 capsule Oral Q0700  . insulin aspart  0-15  Units Subcutaneous TID WC  . insulin aspart  0-5 Units Subcutaneous QHS  . polyvinyl alcohol  1 drop Right Eye TID  . potassium chloride  40 mEq Oral BID  . potassium chloride  40 mEq Oral Q3H  . rivastigmine  9.5 mg Transdermal Daily  . simvastatin  20 mg Oral q1800  . DISCONTD: ALPRAZolam  0.25 mg Oral BID   Continuous Infusions:  PRN Meds:.acetaminophen, albuterol, ALPRAZolam, diphenhydrAMINE, diphenhydrAMINE, haloperidol, HYDROcodone-acetaminophen, loperamide, magnesium hydroxide, naloxone, ondansetron (ZOFRAN) IV, ondansetron (ZOFRAN) IV, ondansetron, promethazine, sodium chloride, traMADol, DISCONTD: oxyCODONE  Assessment/Plan:  1. Left hip fracture it is, status post left hip replacement.  2. Dementia, moderate to severe.  3. Hypertension.  4. Diabetes mellitus.  5. Anemia related to blood loss, status post 1 unit blood transfusion. Hemoglobin stable now. 6. UTI, positive UA, urine culture pending, started on rocephin 7. Encephalopathy, possibly due to underlying infection and possibly pain meds, adjusting pain meds.  Dispo: will need transfer to SNF when stable   LOS: 6 days   Sherhonda Gaspar [facility] 07/26/2011, 3:31 PM

## 2011-07-27 ENCOUNTER — Inpatient Hospital Stay
Admission: RE | Admit: 2011-07-27 | Discharge: 2011-07-29 | Disposition: A | Discharge status: Nursing Home (Includes ICF) | Payer: PRIVATE HEALTH INSURANCE | Source: Ambulatory Visit | Attending: Internal Medicine | Admitting: Internal Medicine

## 2011-07-27 DIAGNOSIS — J189 Pneumonia, unspecified organism: Principal | ICD-10-CM

## 2011-07-27 DIAGNOSIS — R197 Diarrhea, unspecified: Secondary | ICD-10-CM

## 2011-07-27 LAB — BASIC METABOLIC PANEL
BUN: 7 mg/dL (ref 6–23)
CO2: 23 mEq/L (ref 19–32)
Chloride: 100 mEq/L (ref 96–112)
Creatinine, Ser: 0.37 mg/dL — ABNORMAL LOW (ref 0.50–1.10)
Glucose, Bld: 156 mg/dL — ABNORMAL HIGH (ref 70–99)

## 2011-07-27 MED ORDER — ENOXAPARIN SODIUM 40 MG/0.4ML ~~LOC~~ SOLN: 40.0000 mg | Freq: Every day | SUBCUTANEOUS | Status: DC

## 2011-07-27 MED ORDER — TRAMADOL HCL 50 MG PO TABS
50.0000 mg | ORAL_TABLET | Freq: Four times a day (QID) | ORAL | Status: AC | PRN
Start: 2011-07-27 — End: 2011-08-06

## 2011-07-27 MED ORDER — CIPROFLOXACIN HCL 500 MG PO TABS: 500.0000 mg | ORAL_TABLET | Freq: Two times a day (BID) | ORAL | Status: DC

## 2011-07-27 MED ORDER — IBUPROFEN 400 MG PO TABS: 400.0000 mg | ORAL_TABLET | Freq: Four times a day (QID) | ORAL | Status: AC | PRN

## 2011-07-27 MED ORDER — IBUPROFEN 800 MG PO TABS
400.0000 mg | ORAL_TABLET | Freq: Four times a day (QID) | ORAL | Status: DC | PRN
  Administered 2011-07-27: 400 mg via ORAL
  Filled 2011-07-27: qty 1

## 2011-07-27 MED ORDER — ACETAMINOPHEN 325 MG PO TABS: 650.0000 mg | ORAL_TABLET | Freq: Three times a day (TID) | ORAL | Status: AC

## 2011-07-27 NOTE — Progress Notes (Addendum)
On the evening of 07/25/2011, patient was lethargic, but complaining of pain.  I gave her Oxy IR 5 mg twice throughout the night, which helped her pain.  Again, she was lethargic before she took the medication, but no more so after the medication.  The Oxy IR and the Norco were discontinued today, as I understand, for lethargy and the only pain medication she had was Ultram.  I have given the medication twice tonight, but patient has continued to cry and complain of pain.  She is also very confused tonight.  I paged Dr. Orvan Falconer who put in an order for ibuprofen 400 mg, which I will give as soon as pharmacy approves it.  Repositioning was tried and patient had a sitter in the room with her, who has been unable to console her.  She did not recall having met me and did not recall falling or breaking any bones.  I will give the ibuprofen as prescribed, but something else may need to be considered if it does not help the pain.  I will put another progress note in once I see how the patient responds.  06:41 AM - Patient had a fairly good response to the ibuprofen, taking her pain down to a 4 and she has stopped crying.

## 2011-07-27 NOTE — Progress Notes (Signed)
Subjective: 5 Days Post-Op Procedure(s) (LRB): ARTHROPLASTY BIPOLAR HIP (Left) Patient reports pain as 2 on 0-10 scale.    Objective: Vital signs in last 24 hours: Temp:  [98 F (36.7 C)-98.3 F (36.8 C)] 98.3 F (36.8 C) (01/14 0509) Pulse Rate:  [76-77] 77  (01/14 0509) Resp:  [19-20] 20  (01/14 0509) BP: (105-135)/(67-69) 135/67 mmHg (01/14 0509) SpO2:  [99 %-100 %] 99 % (01/14 0509) Weight:  [64.139 kg (141 lb 6.4 oz)] 64.139 kg (141 lb 6.4 oz) (01/14 0509)  Intake/Output from previous day: 01/13 0701 - 01/14 0700 In: 348.8 [P.O.:240; I.V.:58.8; IV Piggyback:50] Out: 1525 [Urine:1525] Intake/Output this shift:     Basename 07/25/11 0606  HGB 9.9*    Basename 07/25/11 0606  WBC 5.5  RBC 3.12*  HCT 28.8*  PLT 313    Basename 07/27/11 0518 07/26/11 0627 07/26/11 0622  NA 130* -- 134*  K 3.8 4.4 --  CL 100 -- 104  CO2 23 -- 25  BUN 7 -- 11  CREATININE 0.37* -- 0.38*  GLUCOSE 156* -- 162*  CALCIUM 8.9 -- 9.1   No results found for this basename: LABPT:2,INR:2 in the last 72 hours  ABD soft Neurovascular intact Sensation intact distally Intact pulses distally Dorsiflexion/Plantar flexion intact Incision: no drainage  Leg lengths are equal.  She is alert but confused today.  Hopefully she can cooperate with physical therapy today and take a few steps.  Assessment/Plan: 5 Days Post-Op Procedure(s) (LRB): ARTHROPLASTY BIPOLAR HIP (Left) Up with therapy  Hopefully she will be able for transfer to SNF in the next day or two.  She will need continued enoxaparin subcutaneously for a month. She will need to continue the abduction pillow while in bed for a month. She will need PT but she can weight bear as tolerated. Sutures to be removed 08/02/11 and Steri-strip wound. I will need to see in my office in one month post discharge with x-rays done prior of the hip.  Wyonia Fontanella 07/27/2011, 8:00 AM

## 2011-07-27 NOTE — Progress Notes (Signed)
Pt d/c today by MD back to [facility].  Pt, pt's daughter, and facility are aware and agreeable.  Pt to transfer with RN.  D/C faxed.  Karn Cassis

## 2011-07-27 NOTE — Progress Notes (Signed)
Physical Therapy Treatment Patient Details Name: Michelle Sweeney MRN: [id number] DOB: Mar 24, 1933 Today's Date: 07/27/2011  TIME: [id number]/44mins TE-40mins GT  PT Assessment/Plan  PT - Assessment/Plan Comments on Treatment Session: Pt somewhat confused forgetting what we just did but able to follow all commands. Bed exercises were performed without difficutly and pt was able to to ambulate with a left platform RW from Bed<>chair-3' Mod A. Pt has a posterior lean and confusion on sequencing of gaiit and RW PT Goals  Acute Rehab PT Goals PT Goal: Supine/Side to Sit - Progress: Met PT Goal: Sit to Stand - Progress: Progressing toward goal PT Goal: Stand to Sit - Progress: Progressing toward goal PT Goal: Ambulate - Progress: Progressing toward goal  PT Treatment Precautions/Restrictions  Restrictions Weight Bearing Restrictions: Yes LUE Weight Bearing: Non weight bearing LLE Weight Bearing: Touchdown weight bearing Mobility (including Balance) Bed Mobility Supine to Sit: 4: Min assist Supine to Sit Details (indicate cue type and reason): assistance needed moving LLE and verbal cues to use RUE for assistance in pushing up from bed Sitting - Scoot to Edge of Bed: 6: Modified independent (Device/Increase time) Transfers Transfers: Yes Sit to Stand: 3: Mod assist Sit to Stand Details (indicate cue type and reason): verbal and tactile cues for anterior weight shift of hips Stand to Sit: 4: Min assist Stand to Sit Details: verbal cues to reach back to control descent Ambulation/Gait Ambulation/Gait: Yes Ambulation/Gait Assistance: 3: Mod assist Ambulation/Gait Assistance Details (indicate cue type and reason): verbal and tactile cues for anterior weight shift of hips; verbal and tactile cues for RW and gait technique Ambulation Distance (Feet): 3 Feet Assistive device: Left platform walker Gait Pattern: Step-to pattern;Decreased stance time - left;Decreased step length - right (posterior  leaning) Gait velocity: slow Stairs: No Wheelchair Mobility Wheelchair Mobility: No    Exercise  General Exercises - Lower Extremity Ankle Circles/Pumps: Both;10 reps Quad Sets: Both;10 reps Short Arc Quad: Both;10 reps Heel Slides: Both;10 reps;AAROM Hip ABduction/ADduction: AAROM;Both;10 reps Other Exercises Other Exercises: sit <>stand x 3 End of Session PT - End of Session Activity Tolerance: Patient tolerated treatment well Patient left: in chair;with call bell in reach;with family/visitor present (chair alarm set and sitter present) Nurse Communication: Mobility status for transfers;Mobility status for ambulation General Behavior During Session: [facility] for tasks performed Cognition: [facility] for tasks performed  Lasheka Kempner ATKINSO 07/27/2011, 11:05 AM

## 2011-07-27 NOTE — Progress Notes (Signed)
Pt discharged to [facility] via staff; pt and family aware of discharge, have no questions or concerns; Report called to Zella Ball, RN at [facility], who stated understanding and denied questions/concerns; pt stable at time of discharge

## 2011-07-27 NOTE — Discharge Summary (Addendum)
Physician Discharge Summary  Patient ID: MACKENA PLUMMER MRN: [id number] DOB/AGE: [birth year] 78 y.o.  Admit date: 07/20/2011 Discharge date: 07/27/2011  Primary Care Physician:  Donzetta Sprung, MD   Discharge Diagnoses:    Principal Problem:  *Hip fracture, left Active Problems:  Fall  Physical deconditioning  Alzheimer disease  UTI (lower urinary tract infection)  Encephalopathy    Current Discharge Medication List    START taking these medications   Details  ciprofloxacin (CIPRO) 500 MG tablet Take 1 tablet (500 mg total) by mouth 2 (two) times daily. Qty: 10 tablet    enoxaparin (LOVENOX) 40 MG/0.4ML SOLN Inject 0.4 mLs (40 mg total) into the skin daily. Until 08/22/11 Qty: 11.2 mL, Refills: 0    ibuprofen (ADVIL,MOTRIN) 400 MG tablet Take 1 tablet (400 mg total) by mouth every 6 (six) hours as needed for pain. Qty: 30 tablet    traMADol (ULTRAM) 50 MG tablet Take 1 tablet (50 mg total) by mouth every 6 (six) hours as needed. Qty: 30 tablet, Refills: 0      CONTINUE these medications which have CHANGED   Details  acetaminophen (TYLENOL) 325 MG tablet Take 2 tablets (650 mg total) by mouth 3 (three) times daily. Qty: 30 tablet      CONTINUE these medications which have NOT CHANGED   Details  ALPRAZolam (XANAX) 0.25 MG tablet Take 0.25 mg by mouth 2 (two) times daily.      aspirin EC 81 MG tablet Take 81 mg by mouth every morning.     bimatoprost (LUMIGAN) 0.03 % ophthalmic solution Place 1 drop into both eyes at bedtime.      Calcium Carbonate-Vitamin D (CALCIUM 600 + D PO) Take 1 tablet by mouth 2 (two) times daily.      cholecalciferol (VITAMIN D) 1000 UNITS tablet Take 1,000 Units by mouth daily.      citalopram (CELEXA) 20 MG tablet Take 20 mg by mouth daily.      haloperidol (HALDOL) 1 MG tablet Take 1 mg by mouth 2 (two) times daily as needed. For severe agitation     Lactobacillus (FLORAJEN ACIDOPHILUS) CAPS Take 1 capsule by mouth every morning.       lovastatin (MEVACOR) 20 MG tablet Take 20 mg by mouth at bedtime.      Polyethyl Glycol-Propyl Glycol (SYSTANE) 0.4-0.3 % SOLN Place 1 drop into the right eye 3 (three) times daily. UNTIL GONE    potassium chloride (KLOR-CON) 20 MEQ packet Take 20 mEq by mouth 2 (two) times daily.     rivastigmine (EXELON) 9.5 mg/24hr Place 1 patch onto the skin daily. Remove used patches before applying a new patch    albuterol (PROVENTIL HFA;VENTOLIN HFA) 108 (90 BASE) MCG/ACT inhaler Inhale 2 puffs into the lungs every 6 (six) hours as needed. wheezing     loperamide (IMODIUM) 2 MG capsule Take 2 capsules (4 mg total) by mouth 3 (three) times daily as needed for diarrhea or loose stools. Loose stool Qty: 30 capsule       Discharge exam: Blood pressure 135/67, pulse 77, temperature 98.3 F (36.8 C), temperature source Oral, resp. rate 20, height 5\' 6"  (1.676 m), weight 64.139 kg (141 lb 6.4 oz), SpO2 99.00%. NAD, awake, pleasantly confused, sitting in chair CTA B S1, S2 RRR Soft, BS+ No edema b/l  Disposition and Follow-up:  Follow up with Dr. Hilda Lias in one month, with xrays of left hip done prior to visit She will need her staples removed 08/02/11 and steri-strips applied  She can weight bear as tolerated Continue abduction pillow while in bed for next one month  Consults:  Orthopedics, Dr. Hilda Lias   Significant Diagnostic Studies:  Dg Chest 1 View  07/20/2011  *RADIOLOGY REPORT*  Clinical Data: Fall, question hip fracture  CHEST - 1 VIEW  Comparison: Chest radiograph 06/20/2011  Findings: Stable cardiac silhouette.  No effusion, infiltrate, pneumothorax. There is a fracture of the left humeral head which is an impaction type fracture.  Difficult to tell the chronicity of this fracture.  IMPRESSION:  Fracture of the left proximal humerus.  Original Report Authenticated By: Genevive Bi, M.D.   Dg Hip Complete Left  07/20/2011  *RADIOLOGY REPORT*  Clinical Data: Left hip pain  LEFT HIP -  COMPLETE 2+ VIEW  Comparison: Plain films 07/06/2011 the  Findings: There is a sub capital fracture of the left femoral neck with varus angulation.  Hip is located.  IMPRESSION: Subcapital left femoral neck fracture.  Original Report Authenticated By: Genevive Bi, M.D.   Ct Head Wo Contrast  07/20/2011  *RADIOLOGY REPORT*  Clinical Data:  Hip pain, fall, the  CT HEAD WITHOUT CONTRAST CT CERVICAL SPINE WITHOUT CONTRAST  Technique:  Multidetector CT imaging of the head and cervical spine was performed following the standard protocol without intravenous contrast.  Multiplanar CT image reconstructions of the cervical spine were also generated.  Comparison:  Head CT 06/30/2011  CT HEAD  Findings: There is a small scalp hematoma over the lateral left frontal bone.  There is no evidence of intracranial hemorrhage.  No focal mass lesion.  No CT evidence of acute infarction.  There is generalized cortical atrophy.  Periventricular white matter hypodensities are present.  There is no evidence skull fracture.  No fluid the paranasal sinuses or mastoid air cells.  IMPRESSION:  1.  Small left scalp hematoma. 2.  No evidence of intracranial trauma. 3.  Atrophy and microvascular disease.  CT CERVICAL SPINE  Findings: There is no prevertebral soft tissue swelling.  Normal alignment cervical pain bodies.  There is endplate osteophytosis and loss of disc space in the lower cervical spine.  Normal cervical facet articulation.  Normal craniocervical junction.  No evidence epidural paraspinal hematoma.  IMPRESSION:  1.  No evidence of cervical spine fracture. 2.  Multilevel spondylosis.  Original Report Authenticated By: Genevive Bi, M.D.   Ct Cervical Spine Wo Contrast  07/20/2011  *RADIOLOGY REPORT*  Clinical Data:  Hip pain, fall, the  CT HEAD WITHOUT CONTRAST CT CERVICAL SPINE WITHOUT CONTRAST  Technique:  Multidetector CT imaging of the head and cervical spine was performed following the standard protocol without  intravenous contrast.  Multiplanar CT image reconstructions of the cervical spine were also generated.  Comparison:  Head CT 06/30/2011  CT HEAD  Findings: There is a small scalp hematoma over the lateral left frontal bone.  There is no evidence of intracranial hemorrhage.  No focal mass lesion.  No CT evidence of acute infarction.  There is generalized cortical atrophy.  Periventricular white matter hypodensities are present.  There is no evidence skull fracture.  No fluid the paranasal sinuses or mastoid air cells.  IMPRESSION:  1.  Small left scalp hematoma. 2.  No evidence of intracranial trauma. 3.  Atrophy and microvascular disease.  CT CERVICAL SPINE  Findings: There is no prevertebral soft tissue swelling.  Normal alignment cervical pain bodies.  There is endplate osteophytosis and loss of disc space in the lower cervical spine.  Normal cervical facet articulation.  Normal  craniocervical junction.  No evidence epidural paraspinal hematoma.  IMPRESSION:  1.  No evidence of cervical spine fracture. 2.  Multilevel spondylosis.  Original Report Authenticated By: Genevive Bi, M.D.    Brief H and P: For complete details please refer to admission H and P, but in brief 78 year old female with moderate numbers dementia who is at the nursing home and got up from her wheelchair by herself and fail and broke her left hip. Patient has dementia she is verbal but cannot answer questions appropriately. Her daughter and her son are with her right now. She also had her fall several weeks ago where she broke her left upper chin which is now casted. She's currently in a rehabilitation center getting physical therapy to minimize her falls. She did not pass out today. There is no report of any fevers or any recent illnesses. She currently is laying in the emergency room at bed and looks comfortable and denies any pain.     Hospital Course:  Patient was admitted to the hospital after suffering a left hip fracture.   She underwent surgery by Dr. Hilda Lias and had a left bipolar hip prosthesis placed.  She tolerated this without complications.  She is currently weight bearing as tolerated and will need to go to a SNF for continued therapy.  Instructions regarding follow up with Dr. Hilda Lias and removal of staples is noted above.  She had some lethargy while in the hospital.  Her urinalysis was found to be positive, urine culture is currently pending.  She was started on rocephin and will be switched to oral cipro.  It was also felt that her narcotics were playing a role here.  She was continued on tramadol and ibuprofen with scheduled tylenol.  She appears to be doing better with this and narcotics should be used with caution as she does appear to be sensitive.  The patient does have some baseline mod to severe dementia and currently appears to be at her baseline.  Post operatively she did have some anemia and was transfused 1 unit PRBC.  Her hemoglobin has remained stable since then.  She is stable for discharge to SNF.  Case was discussed with Dr. Hilda Lias as well as her daughter who agree with above plan.  Time spent on Discharge:  Signed: MEMON,JEHANZEB [facility] 07/27/2011, 11:32 AM

## 2011-07-28 LAB — GLUCOSE, CAPILLARY
Glucose-Capillary: 125 mg/dL — ABNORMAL HIGH (ref 70–99)
Glucose-Capillary: 197 mg/dL — ABNORMAL HIGH (ref 70–99)
Glucose-Capillary: 219 mg/dL — ABNORMAL HIGH (ref 70–99)

## 2011-07-29 ENCOUNTER — Other Ambulatory Visit: Payer: Self-pay

## 2011-07-29 ENCOUNTER — Ambulatory Visit (HOSPITAL_COMMUNITY): Payer: Medicare Other

## 2011-07-29 ENCOUNTER — Emergency Department (HOSPITAL_COMMUNITY): Payer: Medicare Other

## 2011-07-29 ENCOUNTER — Emergency Department (HOSPITAL_COMMUNITY)
Admission: EM | Admit: 2011-07-29 | Discharge: 2011-07-29 | Disposition: A | Discharge status: Skilled Nursing Facility | Payer: Medicare Other | Attending: Emergency Medicine | Admitting: Emergency Medicine

## 2011-07-29 ENCOUNTER — Ambulatory Visit (HOSPITAL_COMMUNITY): Payer: Medicare Other | Attending: Internal Medicine

## 2011-07-29 ENCOUNTER — Encounter (HOSPITAL_COMMUNITY): Payer: Self-pay | Admitting: Orthopaedic Surgery

## 2011-07-29 ENCOUNTER — Inpatient Hospital Stay
Admission: RE | Admit: 2011-07-29 | Discharge: 2011-09-04 | Disposition: A | Discharge status: Nursing Home (Includes ICF) | Payer: Medicare Other | Source: Ambulatory Visit | Attending: Internal Medicine | Admitting: Internal Medicine

## 2011-07-29 DIAGNOSIS — R05 Cough: Secondary | ICD-10-CM

## 2011-07-29 DIAGNOSIS — E785 Hyperlipidemia, unspecified: Secondary | ICD-10-CM | POA: Insufficient documentation

## 2011-07-29 DIAGNOSIS — Z9079 Acquired absence of other genital organ(s): Secondary | ICD-10-CM | POA: Insufficient documentation

## 2011-07-29 DIAGNOSIS — K589 Irritable bowel syndrome without diarrhea: Secondary | ICD-10-CM | POA: Insufficient documentation

## 2011-07-29 DIAGNOSIS — R079 Chest pain, unspecified: Secondary | ICD-10-CM | POA: Insufficient documentation

## 2011-07-29 DIAGNOSIS — Z794 Long term (current) use of insulin: Secondary | ICD-10-CM | POA: Insufficient documentation

## 2011-07-29 DIAGNOSIS — I4460 Unspecified fascicular block: Secondary | ICD-10-CM | POA: Insufficient documentation

## 2011-07-29 DIAGNOSIS — F0280 Dementia in other diseases classified elsewhere without behavioral disturbance: Secondary | ICD-10-CM | POA: Insufficient documentation

## 2011-07-29 DIAGNOSIS — T148XXA Other injury of unspecified body region, initial encounter: Secondary | ICD-10-CM

## 2011-07-29 DIAGNOSIS — M810 Age-related osteoporosis without current pathological fracture: Secondary | ICD-10-CM | POA: Insufficient documentation

## 2011-07-29 DIAGNOSIS — S129XXA Fracture of neck, unspecified, initial encounter: Principal | ICD-10-CM

## 2011-07-29 DIAGNOSIS — F411 Generalized anxiety disorder: Secondary | ICD-10-CM | POA: Insufficient documentation

## 2011-07-29 DIAGNOSIS — I10 Essential (primary) hypertension: Secondary | ICD-10-CM | POA: Insufficient documentation

## 2011-07-29 DIAGNOSIS — F419 Anxiety disorder, unspecified: Secondary | ICD-10-CM

## 2011-07-29 DIAGNOSIS — Z96649 Presence of unspecified artificial hip joint: Secondary | ICD-10-CM | POA: Insufficient documentation

## 2011-07-29 DIAGNOSIS — M129 Arthropathy, unspecified: Secondary | ICD-10-CM | POA: Insufficient documentation

## 2011-07-29 DIAGNOSIS — E119 Type 2 diabetes mellitus without complications: Secondary | ICD-10-CM | POA: Insufficient documentation

## 2011-07-29 DIAGNOSIS — G309 Alzheimer's disease, unspecified: Secondary | ICD-10-CM | POA: Insufficient documentation

## 2011-07-29 LAB — CARDIAC PANEL(CRET KIN+CKTOT+MB+TROPI)
CK, MB: 2.6 ng/mL (ref 0.3–4.0)
Relative Index: INVALID (ref 0.0–2.5)
Relative Index: INVALID (ref 0.0–2.5)
Total CK: 38 U/L (ref 7–177)
Troponin I: <0.3 ng/mL (ref <0.30)

## 2011-07-29 LAB — URINALYSIS, ROUTINE W REFLEX MICROSCOPIC
Nitrite: NEGATIVE
Specific Gravity, Urine: 1.015 (ref 1.005–1.030)
Urobilinogen, UA: 0.2 mg/dL (ref 0.0–1.0)

## 2011-07-29 LAB — COMPREHENSIVE METABOLIC PANEL
BUN: 16 mg/dL (ref 6–23)
Calcium: 10 mg/dL (ref 8.4–10.5)
GFR calc Af Amer: >90 mL/min (ref >90)
Glucose, Bld: 181 mg/dL — ABNORMAL HIGH (ref 70–99)
Total Protein: 6.4 g/dL (ref 6.0–8.3)

## 2011-07-29 LAB — DIFFERENTIAL
Eosinophils Absolute: 0 10*3/uL (ref 0.0–0.7)
Eosinophils Relative: 1 % (ref 0–5)
Lymphs Abs: 1.1 10*3/uL (ref 0.7–4.0)
Monocytes Relative: 9 % (ref 3–12)

## 2011-07-29 LAB — CBC
MCH: 31 pg (ref 26.0–34.0)
MCV: 90.9 fL (ref 78.0–100.0)
Platelets: 413 10*3/uL — ABNORMAL HIGH (ref 150–400)
RBC: 3.61 MIL/uL — ABNORMAL LOW (ref 3.87–5.11)

## 2011-07-29 LAB — LIPASE, BLOOD: Lipase: 7 U/L — ABNORMAL LOW (ref 11–59)

## 2011-07-29 MED ORDER — IOHEXOL 350 MG/ML SOLN
100.0000 mL | Freq: Once | INTRAVENOUS | Status: AC | PRN
  Administered 2011-07-29: 100 mL via INTRAVENOUS

## 2011-07-29 NOTE — ED Notes (Signed)
ems called for return transport to facility.

## 2011-07-29 NOTE — ED Notes (Signed)
Pt transported to [facility].

## 2011-07-29 NOTE — ED Notes (Signed)
Pt reports no cp at this time. NAD noted. Family at bedside. No needs voiced at this time.

## 2011-07-29 NOTE — ED Notes (Signed)
Pt's family in and states that patient has been very anxious and that she found 2 Exelon patches on her this am. Also states that when she gets anxious she usually hurts in her chest.

## 2011-07-29 NOTE — ED Notes (Signed)
Pt denies pain at this time. Pt very anxious and breathing rapidly at times. Pt has abduction pillow in place and cast to left arm. Pt able to wiggle fingers and toes. Strong pulses palpated. Cardiac monitor showing NSR. Family at bedside.

## 2011-07-29 NOTE — ED Notes (Signed)
Pt c/o chest pain earlier today but denies pain at this time. Pt c/o cough and congestion. Also c/o generalized aches and pains. Pt has recent fracture of left arm with cast in place and left hip fracture from fall.

## 2011-07-29 NOTE — ED Provider Notes (Signed)
History   This chart was scribed for [name], MD by Clarita Crane. The patient was seen in room APA02/APA02 and the patient's care was started at 3:43PM.   CSN: [id number]  Arrival date & time 07/29/11  1519   First MD Initiated Contact with Patient 07/29/11 1537      Chief Complaint  Patient presents with  . Chest Pain    (Consider location/radiation/quality/duration/timing/severity/associated sxs/prior treatment) HPI Michelle Sweeney is a 78 y.o. female who presents to the Emergency Department complaining of constant moderate to severe episode of chest pain onset several hours ago with associated nausea but is currently resolved. Patient's family member states she was informed by [facility] nursing staff that patient was complaining of moderate to severe chest pain several hours ago while lying in bed. Patient denies vomiting, abdominal pain, constipation, fever, SOB, diaphoresis with episode of chest pain. Patient's family member states that patient has experienced similar chest pain previously with episodes of anxiety. Family member also reports that patient sustained fracture to left elbow and left hip 2 days ago which is currently being followed by Dr. Hilda Lias. Patient with h/o HTN, HLD, hyperkalemia, diabetes, Alzheimer's Disease.   PCP- Dr. Ralene Cork Past Medical History  Diagnosis Date  . IBS (irritable bowel syndrome)   . HTN (hypertension)   . Osteoporosis   . Hyperlipidemia   . Hyperkalemia   . Diabetes mellitus   . Alzheimer disease   . Arthritis     Past Surgical History  Procedure Date  . Abdominal hysterectomy   . Cardiac catheterization   . Hip arthroplasty 07/22/2011    Procedure: ARTHROPLASTY BIPOLAR HIP;  Surgeon: Darreld Mclean, MD;  Location: [facility];  Service: Orthopedics;  Laterality: Left;    History reviewed. No pertinent family history.  History  Substance Use Topics  . Smoking status: Never Smoker   . Smokeless tobacco: Not on  file  . Alcohol Use: No    OB History    Grav Para Term Preterm Abortions TAB SAB Ect Mult Living                  Review of Systems 10 Systems reviewed and are negative for acute change except as noted in the HPI.  Allergies  Penicillins and Quinine derivatives  Home Medications   Current Outpatient Rx  Name Route Sig Dispense Refill  . ACETAMINOPHEN 325 MG PO TABS Oral Take 2 tablets (650 mg total) by mouth 3 (three) times daily. 30 tablet   . ALBUTEROL SULFATE HFA 108 (90 BASE) MCG/ACT IN AERS Inhalation Inhale 2 puffs into the lungs every 6 (six) hours as needed. wheezing     . ALPRAZOLAM 0.25 MG PO TABS Oral Take 0.25 mg by mouth 2 (two) times daily. ANXIETY    . ASPIRIN EC 81 MG PO TBEC Oral Take 81 mg by mouth every morning.     Marland Kitchen BIMATOPROST 0.03 % OP SOLN Both Eyes Place 1 drop into both eyes at bedtime.      Marland Kitchen CALCIUM 600 + D PO Oral Take 1 tablet by mouth 2 (two) times daily.      Marland Kitchen VITAMIN D 1000 UNITS PO TABS Oral Take 1,000 Units by mouth every morning.     Marland Kitchen CITALOPRAM HYDROBROMIDE 20 MG PO TABS Oral Take 20 mg by mouth every morning. DEPRESSION    . ENOXAPARIN SODIUM 40 MG/0.4ML [place] SOLN Subcutaneous Inject 0.4 mLs (40 mg total) into the skin daily. Until 08/22/11 11.2  mL 0  . GLUCOSE BLOOD VI STRP Other 1 each by Other route at bedtime. **Use as instructed WITH NO SLIDING SCALE. NOTIFY MD OF RESULTS <60 or >300**    . HALOPERIDOL 1 MG PO TABS Oral Take 1 mg by mouth 2 (two) times daily as needed. For severe agitation     . IBUPROFEN 400 MG PO TABS Oral Take 1 tablet (400 mg total) by mouth every 6 (six) hours as needed for pain. 30 tablet   . INSULIN ASPART 100 UNIT/ML [place] SOLN Subcutaneous Inject into the skin as directed. Per sliding scale instructions: If <200=NO COVERAGE, 201-250= 2units, 251-300= 4units, 301-350= 6units, 351-400= 8units, 401-450= 10 units, **CALL MD WITH RESULTS <60 or >300**    . FLORAJEN ACIDOPHILUS PO CAPS Oral Take 1 capsule by mouth every  morning.     Marland Kitchen LOPERAMIDE HCL 2 MG PO CAPS Oral Take 2 capsules (4 mg total) by mouth 3 (three) times daily as needed for diarrhea or loose stools. Loose stool 30 capsule   . LOVASTATIN 20 MG PO TABS Oral Take 20 mg by mouth at bedtime.      Marland Kitchen POLYETHYL GLYCOL-PROPYL GLYCOL 0.4-0.3 % OP SOLN Right Eye Place 1 drop into the right eye 3 (three) times daily. UNTIL GONE    . POTASSIUM CHLORIDE 20 MEQ PO PACK Oral Take 20 mEq by mouth 2 (two) times daily.     Marland Kitchen RIVASTIGMINE 9.5 MG/24HR TD PT24 Transdermal Place 1 patch onto the skin daily. Remove used patches before applying a new patch    . SULFAMETHOXAZOLE-TMP DS 800-160 MG PO TABS Oral Take 1 tablet by mouth 2 (two) times daily. For 7 days for UTI    . TRAMADOL HCL 50 MG PO TABS Oral Take 1 tablet (50 mg total) by mouth every 6 (six) hours as needed. 30 tablet 0    BP 113/55  Pulse 89  Temp(Src) 98.2 F (36.8 C) (Oral)  Resp 18  SpO2 100%  Physical Exam  Nursing note and vitals reviewed. Constitutional: She is oriented to person, place, and time. She appears well-developed and well-nourished. No distress.  HENT:  Head: Normocephalic and atraumatic.  Eyes: EOM are normal. Pupils are equal, round, and reactive to light.  Neck: Neck supple. No tracheal deviation present.  Cardiovascular: Normal rate and regular rhythm.  Exam reveals no gallop and no friction rub.   No murmur heard.      Cap refill intact.   Pulmonary/Chest: Effort normal. No respiratory distress. She has no wheezes. She has no rales.  Abdominal: Soft. She exhibits no distension. There is no tenderness. There is no rebound and no guarding.  Musculoskeletal: Normal range of motion. She exhibits no edema.       Good ROM of all fingers. Ecchymosis to left gluteal region. Staple line to left hip with no signs of infection or drainage.   Neurological: She is alert and oriented to person, place, and time. No sensory deficit.  Skin: Skin is warm and dry.       No skin breakdown  about cast of LUE.   Psychiatric: She has a normal mood and affect. Her behavior is normal.    ED Course  Procedures (including critical care time)  DIAGNOSTIC STUDIES: Oxygen Saturation is 99% on room air, normal by my interpretation.    COORDINATION OF CARE: 3:48PM- Patient and family member explained current clinical impression and intent to obtain additional imaging and laboratory results. Patient and family member agree with  plan set forth at this time.  3:50PM- Imaging and laboratory results ordered. No medications ordered at this time.  5:07PM- Patient reports she is feeling normal at this time and denies any additional episodes of chest pain since last check.  7:30PM- Patient informed of current lab and imaging results and intent to d/c home. Patient agrees with plan set forth at this time.   Labs Reviewed  CBC - Abnormal; Notable for the following:    RBC 3.61 (*)    Hemoglobin 11.2 (*)    HCT 32.8 (*)    Platelets 413 (*)    All other components within normal limits  COMPREHENSIVE METABOLIC PANEL - Abnormal; Notable for the following:    Glucose, Bld 181 (*)    Albumin 2.7 (*)    GFR calc non Af Amer 89 (*)    All other components within normal limits  LIPASE, BLOOD - Abnormal; Notable for the following:    Lipase 7 (*)    All other components within normal limits  URINALYSIS, ROUTINE W REFLEX MICROSCOPIC - Abnormal; Notable for the following:    Bilirubin Urine SMALL (*)    Ketones, ur 40 (*)    All other components within normal limits  DIFFERENTIAL  CARDIAC PANEL(CRET KIN+CKTOT+MB+TROPI)  CARDIAC PANEL(CRET KIN+CKTOT+MB+TROPI)   Ct Angio Chest W/cm &/or Wo Cm  07/29/2011  *RADIOLOGY REPORT*  Clinical Data: Chest pain and short of breath.  Recent hip surgery  CT ANGIOGRAPHY CHEST  Technique:  Multidetector CT imaging of the chest using the standard protocol during bolus administration of intravenous contrast. Multiplanar reconstructed images including MIPs were  obtained and reviewed to evaluate the vascular anatomy.  Contrast: OMNIPAQUE IOHEXOL 350 MG/ML IV SOLN  Comparison: Chest x-ray 07/20/2011  Findings: Negative for pulmonary embolism.  Coronary artery calcification is present.  Ascending aorta is mildly dilated measuring 41 mm.  Descending thoracic aorta measures 23 mm.  No dissection is present.  No pericardial effusion.  Heart size is normal.  Lungs are clear.  Negative for infiltrate or effusion.  No mass or adenopathy is present.  Hiatal hernia is present.  Mild fracture T11 is unchanged from prior chest x-rays and appears chronic.  No acute bony lesion.  IMPRESSION: Negative for pulmonary embolism.  No acute abnormality in the chest.  Original Report Authenticated By: Camelia Phenes, M.D.   Dg Abd Acute W/chest  07/29/2011  *RADIOLOGY REPORT*  Clinical Data: Chest pain and congestion; diarrhea  ACUTE ABDOMEN SERIES (ABDOMEN 2 VIEW & CHEST 1 VIEW)  Comparison: None.  Findings: The cardiac silhouette, mediastinum, pulmonary vasculature are within normal limits.  Both lungs are clear. Emphysematous changes are noted.  There is no pneumoperitoneum.  The stool and bowel gas pattern is within normal limits with no evidence of obstruction.  There is no gross organomegaly.  No abnormal calcifications overlie the abdomen or pelvis.  There are degenerative changes within the axial spine. There is no acute bony abnormality.  IMPRESSION: There is no evidence of acute cardiac or pulmonary process.  There is no evidence of bowel obstruction or pneumoperitoneum.  Original Report Authenticated By: Brandon Melnick, M.D.     1. Chest pain   2. Anxiety       MDM  Discharged 2 days ago after L hip arthroplasty, fleeting substernal chest pain at SNF, now resolved.  Uncertain how long it lasted.  No cardiac history.  Daughter states history of chest pain with anxiety.  Cardiac enzymes negative. Given recent  history of surgery and prolonged hospital stay consider  pulmonary embolism despite her normal vital signs.  CT PE negative. Delta troponin negative no further episodes of chest pain in the ED.  Hemoglobin and creatinine at baseline.    I personally performed the services described in this documentation, which was scribed in my presence.  The recorded information has been reviewed and considered.   Date: 07/29/2011  Rate: 88  Rhythm: normal sinus rhythm  QRS Axis: left  Intervals: QT prolonged  ST/T Wave abnormalities: nonspecific ST/T changes  Conduction Disutrbances:left anterior fascicular block  Narrative Interpretation:   Old EKG Reviewed: unchanged    [name], MD 07/29/11 2031

## 2011-07-30 LAB — GLUCOSE, CAPILLARY
Glucose-Capillary: 202 mg/dL — ABNORMAL HIGH (ref 70–99)
Glucose-Capillary: 177 mg/dL — ABNORMAL HIGH (ref 70–99)

## 2011-07-31 LAB — GLUCOSE, CAPILLARY
Glucose-Capillary: 221 mg/dL — ABNORMAL HIGH (ref 70–99)
Glucose-Capillary: 194 mg/dL — ABNORMAL HIGH (ref 70–99)

## 2011-08-01 LAB — GLUCOSE, CAPILLARY
Glucose-Capillary: 163 mg/dL — ABNORMAL HIGH (ref 70–99)
Glucose-Capillary: 166 mg/dL — ABNORMAL HIGH (ref 70–99)

## 2011-08-02 LAB — GLUCOSE, CAPILLARY: Glucose-Capillary: 211 mg/dL — ABNORMAL HIGH (ref 70–99)

## 2011-08-03 LAB — GLUCOSE, CAPILLARY
Glucose-Capillary: 358 mg/dL — ABNORMAL HIGH (ref 70–99)
Glucose-Capillary: 136 mg/dL — ABNORMAL HIGH (ref 70–99)

## 2011-08-04 LAB — GLUCOSE, CAPILLARY
Glucose-Capillary: 210 mg/dL — ABNORMAL HIGH (ref 70–99)
Glucose-Capillary: 327 mg/dL — ABNORMAL HIGH (ref 70–99)

## 2011-08-05 LAB — GLUCOSE, CAPILLARY
Glucose-Capillary: 201 mg/dL — ABNORMAL HIGH (ref 70–99)
Glucose-Capillary: 208 mg/dL — ABNORMAL HIGH (ref 70–99)

## 2011-08-06 LAB — GLUCOSE, CAPILLARY
Glucose-Capillary: 143 mg/dL — ABNORMAL HIGH (ref 70–99)
Glucose-Capillary: 138 mg/dL — ABNORMAL HIGH (ref 70–99)

## 2011-08-07 ENCOUNTER — Ambulatory Visit (HOSPITAL_COMMUNITY)
Admit: 2011-08-07 | Discharge: 2011-08-07 | Disposition: A | Discharge status: Skilled Nursing Facility | Payer: Medicare Other | Source: Skilled Nursing Facility | Attending: Family Medicine | Admitting: Family Medicine

## 2011-08-07 DIAGNOSIS — Z4789 Encounter for other orthopedic aftercare: Secondary | ICD-10-CM | POA: Insufficient documentation

## 2011-08-07 LAB — GLUCOSE, CAPILLARY
Glucose-Capillary: 166 mg/dL — ABNORMAL HIGH (ref 70–99)
Glucose-Capillary: 201 mg/dL — ABNORMAL HIGH (ref 70–99)

## 2011-08-08 LAB — GLUCOSE, CAPILLARY
Glucose-Capillary: 169 mg/dL — ABNORMAL HIGH (ref 70–99)
Glucose-Capillary: 288 mg/dL — ABNORMAL HIGH (ref 70–99)
Glucose-Capillary: 302 mg/dL — ABNORMAL HIGH (ref 70–99)

## 2011-08-09 LAB — GLUCOSE, CAPILLARY: Glucose-Capillary: 160 mg/dL — ABNORMAL HIGH (ref 70–99)

## 2011-08-10 ENCOUNTER — Ambulatory Visit (HOSPITAL_COMMUNITY)
Admit: 2011-08-10 | Discharge: 2011-08-10 | Disposition: A | Discharge status: Skilled Nursing Facility | Payer: Medicare Other | Source: Skilled Nursing Facility | Attending: Internal Medicine | Admitting: Internal Medicine

## 2011-08-10 DIAGNOSIS — M25539 Pain in unspecified wrist: Secondary | ICD-10-CM | POA: Insufficient documentation

## 2011-08-10 DIAGNOSIS — S6990XA Unspecified injury of unspecified wrist, hand and finger(s), initial encounter: Secondary | ICD-10-CM | POA: Insufficient documentation

## 2011-08-10 DIAGNOSIS — M25529 Pain in unspecified elbow: Secondary | ICD-10-CM | POA: Insufficient documentation

## 2011-08-10 DIAGNOSIS — S79929A Unspecified injury of unspecified thigh, initial encounter: Secondary | ICD-10-CM | POA: Insufficient documentation

## 2011-08-10 DIAGNOSIS — Y9210 Unspecified residential institution as the place of occurrence of the external cause: Secondary | ICD-10-CM | POA: Insufficient documentation

## 2011-08-10 DIAGNOSIS — W19XXXA Unspecified fall, initial encounter: Secondary | ICD-10-CM | POA: Insufficient documentation

## 2011-08-10 DIAGNOSIS — M25559 Pain in unspecified hip: Secondary | ICD-10-CM | POA: Insufficient documentation

## 2011-08-10 DIAGNOSIS — S79919A Unspecified injury of unspecified hip, initial encounter: Secondary | ICD-10-CM | POA: Insufficient documentation

## 2011-08-10 DIAGNOSIS — S59909A Unspecified injury of unspecified elbow, initial encounter: Secondary | ICD-10-CM | POA: Insufficient documentation

## 2011-08-10 LAB — GLUCOSE, CAPILLARY: Glucose-Capillary: 191 mg/dL — ABNORMAL HIGH (ref 70–99)

## 2011-08-11 LAB — GLUCOSE, CAPILLARY
Glucose-Capillary: 197 mg/dL — ABNORMAL HIGH (ref 70–99)
Glucose-Capillary: 175 mg/dL — ABNORMAL HIGH (ref 70–99)
Glucose-Capillary: 157 mg/dL — ABNORMAL HIGH (ref 70–99)

## 2011-08-12 LAB — GLUCOSE, CAPILLARY

## 2011-08-13 LAB — GLUCOSE, CAPILLARY: Glucose-Capillary: 110 mg/dL — ABNORMAL HIGH (ref 70–99)

## 2011-08-14 LAB — GLUCOSE, CAPILLARY
Glucose-Capillary: 117 mg/dL — ABNORMAL HIGH (ref 70–99)
Glucose-Capillary: 166 mg/dL — ABNORMAL HIGH (ref 70–99)

## 2011-08-15 ENCOUNTER — Ambulatory Visit (HOSPITAL_COMMUNITY): Payer: Medicare Other | Attending: Internal Medicine

## 2011-08-15 DIAGNOSIS — R05 Cough: Secondary | ICD-10-CM | POA: Insufficient documentation

## 2011-08-15 DIAGNOSIS — R059 Cough, unspecified: Secondary | ICD-10-CM | POA: Insufficient documentation

## 2011-08-16 LAB — GLUCOSE, CAPILLARY: Glucose-Capillary: 177 mg/dL — ABNORMAL HIGH (ref 70–99)

## 2011-08-17 LAB — GLUCOSE, CAPILLARY
Glucose-Capillary: 130 mg/dL — ABNORMAL HIGH (ref 70–99)
Glucose-Capillary: 149 mg/dL — ABNORMAL HIGH (ref 70–99)

## 2011-08-21 LAB — GLUCOSE, CAPILLARY: Glucose-Capillary: 181 mg/dL — ABNORMAL HIGH (ref 70–99)

## 2011-08-22 ENCOUNTER — Inpatient Hospital Stay (HOSPITAL_COMMUNITY): Payer: Medicare Other | Attending: Internal Medicine

## 2011-08-24 LAB — GLUCOSE, CAPILLARY: Glucose-Capillary: 161 mg/dL — ABNORMAL HIGH (ref 70–99)

## 2011-08-26 LAB — GLUCOSE, CAPILLARY

## 2011-08-27 LAB — GLUCOSE, CAPILLARY
Glucose-Capillary: 138 mg/dL — ABNORMAL HIGH (ref 70–99)
Glucose-Capillary: 167 mg/dL — ABNORMAL HIGH (ref 70–99)

## 2011-08-28 LAB — GLUCOSE, CAPILLARY
Glucose-Capillary: 135 mg/dL — ABNORMAL HIGH (ref 70–99)
Glucose-Capillary: 155 mg/dL — ABNORMAL HIGH (ref 70–99)

## 2011-08-30 LAB — GLUCOSE, CAPILLARY: Glucose-Capillary: 172 mg/dL — ABNORMAL HIGH (ref 70–99)

## 2011-08-31 LAB — GLUCOSE, CAPILLARY: Glucose-Capillary: 153 mg/dL — ABNORMAL HIGH (ref 70–99)

## 2011-09-01 LAB — GLUCOSE, CAPILLARY: Glucose-Capillary: 154 mg/dL — ABNORMAL HIGH (ref 70–99)

## 2011-09-03 LAB — GLUCOSE, CAPILLARY

## 2011-09-04 ENCOUNTER — Emergency Department (HOSPITAL_COMMUNITY): Payer: Medicare Other

## 2011-09-04 ENCOUNTER — Encounter (HOSPITAL_COMMUNITY): Payer: Self-pay

## 2011-09-04 ENCOUNTER — Ambulatory Visit (HOSPITAL_COMMUNITY)
Admission: RE | Admit: 2011-09-04 | Discharge: 2011-09-04 | Disposition: A | Discharge status: Home / Self Care | Payer: Medicare Other | Source: Ambulatory Visit | Attending: Internal Medicine | Admitting: Internal Medicine

## 2011-09-04 ENCOUNTER — Other Ambulatory Visit (HOSPITAL_BASED_OUTPATIENT_CLINIC_OR_DEPARTMENT_OTHER): Payer: Self-pay | Admitting: Internal Medicine

## 2011-09-04 ENCOUNTER — Emergency Department (HOSPITAL_COMMUNITY)
Admission: EM | Admit: 2011-09-04 | Discharge: 2011-09-04 | Disposition: A | Discharge status: Home Health | Payer: Medicare Other | Attending: Emergency Medicine | Admitting: Emergency Medicine

## 2011-09-04 DIAGNOSIS — Y9210 Unspecified residential institution as the place of occurrence of the external cause: Secondary | ICD-10-CM | POA: Insufficient documentation

## 2011-09-04 DIAGNOSIS — T07XXXA Unspecified multiple injuries, initial encounter: Secondary | ICD-10-CM

## 2011-09-04 DIAGNOSIS — M25559 Pain in unspecified hip: Secondary | ICD-10-CM | POA: Insufficient documentation

## 2011-09-04 DIAGNOSIS — I10 Essential (primary) hypertension: Secondary | ICD-10-CM | POA: Insufficient documentation

## 2011-09-04 DIAGNOSIS — M129 Arthropathy, unspecified: Secondary | ICD-10-CM | POA: Insufficient documentation

## 2011-09-04 DIAGNOSIS — R52 Pain, unspecified: Secondary | ICD-10-CM

## 2011-09-04 DIAGNOSIS — W19XXXA Unspecified fall, initial encounter: Secondary | ICD-10-CM | POA: Insufficient documentation

## 2011-09-04 DIAGNOSIS — Z794 Long term (current) use of insulin: Secondary | ICD-10-CM | POA: Insufficient documentation

## 2011-09-04 DIAGNOSIS — E785 Hyperlipidemia, unspecified: Secondary | ICD-10-CM | POA: Insufficient documentation

## 2011-09-04 DIAGNOSIS — F0280 Dementia in other diseases classified elsewhere without behavioral disturbance: Secondary | ICD-10-CM | POA: Insufficient documentation

## 2011-09-04 DIAGNOSIS — E119 Type 2 diabetes mellitus without complications: Secondary | ICD-10-CM | POA: Insufficient documentation

## 2011-09-04 DIAGNOSIS — K589 Irritable bowel syndrome without diarrhea: Secondary | ICD-10-CM | POA: Insufficient documentation

## 2011-09-04 DIAGNOSIS — Z79899 Other long term (current) drug therapy: Secondary | ICD-10-CM | POA: Insufficient documentation

## 2011-09-04 DIAGNOSIS — IMO0002 Reserved for concepts with insufficient information to code with codable children: Secondary | ICD-10-CM | POA: Insufficient documentation

## 2011-09-04 DIAGNOSIS — Z7982 Long term (current) use of aspirin: Secondary | ICD-10-CM | POA: Insufficient documentation

## 2011-09-04 DIAGNOSIS — M810 Age-related osteoporosis without current pathological fracture: Secondary | ICD-10-CM | POA: Insufficient documentation

## 2011-09-04 DIAGNOSIS — G309 Alzheimer's disease, unspecified: Secondary | ICD-10-CM | POA: Insufficient documentation

## 2011-09-04 NOTE — ED Notes (Signed)
Pt from [facility], fell from standing this am, c/o pain to left hip area.

## 2011-09-04 NOTE — Discharge Instructions (Signed)
CONTINUE CURRENT MEDICINES.

## 2011-09-04 NOTE — ED Notes (Signed)
Family at bedside. Patient does not need anything at this time. 

## 2011-09-04 NOTE — ED Notes (Signed)
Gave report to [facility], First shift staff will come get pt after they get report.

## 2011-09-04 NOTE — ED Provider Notes (Signed)
History     CSN: [id number]  Arrival date & time 09/04/11  1191   First MD Initiated Contact with Patient 09/04/11 [phone]      Chief Complaint  Patient presents with  . Fall  . Hip Pain    (Consider location/radiation/quality/duration/timing/severity/associated sxs/prior treatment) HPI Level 5 caveat due to Alzheimer disease. Michelle Sweeney is a 78 y.o. female with a h/o  Diabetes, HTN, osteoporosis, alzheimer disease, recent left hip arthroplasty  brought in by ambulance, who presents to the Emergency Department complaining of left hip pain after falling from a standing position per nursing home staff.No LOC. Patient is unable to give an accurate history.  PCP Dr. Reuel Boom   Past Medical History  Diagnosis Date  . IBS (irritable bowel syndrome)   . HTN (hypertension)   . Osteoporosis   . Hyperlipidemia   . Hyperkalemia   . Diabetes mellitus   . Alzheimer disease   . Arthritis     Past Surgical History  Procedure Date  . Abdominal hysterectomy   . Cardiac catheterization   . Hip arthroplasty 07/22/2011    Procedure: ARTHROPLASTY BIPOLAR HIP;  Surgeon: Darreld Mclean, MD;  Location: [facility];  Service: Orthopedics;  Laterality: Left;    History reviewed. No pertinent family history.  History  Substance Use Topics  . Smoking status: Never Smoker   . Smokeless tobacco: Not on file  . Alcohol Use: No    OB History    Grav Para Term Preterm Abortions TAB SAB Ect Mult Living                  Review of Systems  Unable to perform ROS: Dementia    Allergies  Penicillins and Quinine derivatives  Home Medications   Current Outpatient Rx  Name Route Sig Dispense Refill  . ACETAMINOPHEN 325 MG PO TABS Oral Take 2 tablets (650 mg total) by mouth 3 (three) times daily. 30 tablet   . ALBUTEROL SULFATE HFA 108 (90 BASE) MCG/ACT IN AERS Inhalation Inhale 2 puffs into the lungs every 6 (six) hours as needed. wheezing     . ASPIRIN EC 81 MG PO TBEC Oral Take 81 mg by mouth  every morning.     Marland Kitchen BIMATOPROST 0.03 % OP SOLN Both Eyes Place 1 drop into both eyes at bedtime.      Marland Kitchen CALCIUM 600 + D PO Oral Take 1 tablet by mouth 2 (two) times daily.      Marland Kitchen VITAMIN D 1000 UNITS PO TABS Oral Take 1,000 Units by mouth every morning.     Marland Kitchen CITALOPRAM HYDROBROMIDE 20 MG PO TABS Oral Take 20 mg by mouth every morning. DEPRESSION    . ENOXAPARIN SODIUM 40 MG/0.4ML [place] SOLN Subcutaneous Inject 0.4 mLs (40 mg total) into the skin daily. Until 08/22/11 11.2 mL 0  . GLUCOSE BLOOD VI STRP Other 1 each by Other route at bedtime. **Use as instructed WITH NO SLIDING SCALE. NOTIFY MD OF RESULTS <60 or >300**    . HALOPERIDOL 1 MG PO TABS Oral Take 1 mg by mouth 2 (two) times daily as needed. For severe agitation     . INSULIN ASPART 100 UNIT/ML [place] SOLN Subcutaneous Inject into the skin as directed. Per sliding scale instructions: If <200=NO COVERAGE, 201-250= 2units, 251-300= 4units, 301-350= 6units, 351-400= 8units, 401-450= 10 units, **CALL MD WITH RESULTS <60 or >300**    . FLORAJEN ACIDOPHILUS PO CAPS Oral Take 1 capsule by mouth every morning.     Marland Kitchen  LOPERAMIDE HCL 2 MG PO CAPS Oral Take 2 capsules (4 mg total) by mouth 3 (three) times daily as needed for diarrhea or loose stools. Loose stool 30 capsule   . LOVASTATIN 20 MG PO TABS Oral Take 20 mg by mouth at bedtime.      Marland Kitchen POLYETHYL GLYCOL-PROPYL GLYCOL 0.4-0.3 % OP SOLN Right Eye Place 1 drop into the right eye 3 (three) times daily. UNTIL GONE    . POTASSIUM CHLORIDE 20 MEQ PO PACK Oral Take 20 mEq by mouth 2 (two) times daily.     Marland Kitchen RIVASTIGMINE 9.5 MG/24HR TD PT24 Transdermal Place 1 patch onto the skin daily. Remove used patches before applying a new patch      BP 132/91  Pulse 84  Temp(Src) 98.2 F (36.8 C) (Oral)  Resp 20  Ht 5\' 3"  (1.6 m)  Wt 135 lb (61.236 kg)  BMI 23.91 kg/m2  SpO2 100%  Physical Exam Physical examination:  Nursing notes reviewed; Vital signs and O2 SAT reviewed;  Constitutional: Well developed, Well  nourished, Well hydrated, In no acute distress; Head:  Normocephalic, atraumatic; Eyes: EOMI, PERRL, No scleral icterus; ENMT: Mouth and pharynx normal, Mucous membranes moist; Neck: Supple, Full range of motion, No lymphadenopathy; Cardiovascular: Regular rate and rhythm, No murmur, rub, or gallop; Respiratory: Breath sounds clear & equal bilaterally, No rales, rhonchi, wheezes, or rub, Normal respiratory effort/excursion; Chest: Nontender, Movement normal; Abdomen: Soft, Nontender, Nondistended, Normal bowel sounds; Genitourinary: No CVA tenderness; Extremities:Well healed incision from left hip arthroplasty, Pulses normal, No tenderness, No edema, No calf edema or asymmetry.; Neuro: AA&Ox2 Major CN grossly intact.  No gross focal motor or sensory deficits in extremities.; Skin: Color normal, Warm, Dry, skin tear to left elbow, right hand  ED Course  Procedures (including critical care time) Dg Pelvis 1-2 Views  09/04/2011  *RADIOLOGY REPORT*  Clinical Data: Status post fall; hip pain.  Recent left hip replacement.  PELVIS - 1-2 VIEW  Comparison: Abdominal radiograph performed 08/22/2011, and pelvis radiograph performed 08/10/2011  Findings: There is no evidence of fracture or dislocation.  The patient's left hip hemiarthroplasty appears intact; the femoral stem is within normal limits.  There is no evidence of loosening. The right hip joint is unremarkable in appearance.  Degenerative change is noted along the lower lumbar spine; mild sclerosis is seen at the sacroiliac joints.  The visualized bowel gas pattern is grossly unremarkable in appearance.  IMPRESSION:  1.  No evidence of fracture or dislocation. 2.  Left hip hemiarthroplasty appears intact, without evidence of loosening. 3.  Degenerative change along the lower lumbar spine.  Original Report Authenticated By: Tonia Ghent, M.D.      MDM  Nursing home patient who fell from a standing position here with c/o ? Hip pain. Pt stable in ED with no  significant deterioration in condition.The patient appears reasonably screened and/or stabilized for discharge and I doubt any other medical condition or other [facility] requiring further screening, evaluation, or treatment in the ED at this time prior to discharge.  MDM Reviewed: nursing note and vitals Interpretation: x-ray         Nicoletta Dress. Colon Branch, MD 09/04/11 [phone]

## 2012-02-04 DIAGNOSIS — R079 Chest pain, unspecified: Secondary | ICD-10-CM

## 2012-02-26 DIAGNOSIS — R05 Cough: Secondary | ICD-10-CM

## 2012-12-29 IMAGING — CR DG ABDOMEN ACUTE W/ 1V CHEST
3 series · 3 of 3 positions shown · non-contrast
Comparison: None.

CLINICAL DATA: Chest pain and congestion; diarrhea

ACUTE ABDOMEN SERIES (ABDOMEN 2 VIEW & CHEST 1 VIEW)

[view not recorded (1 of 3)]
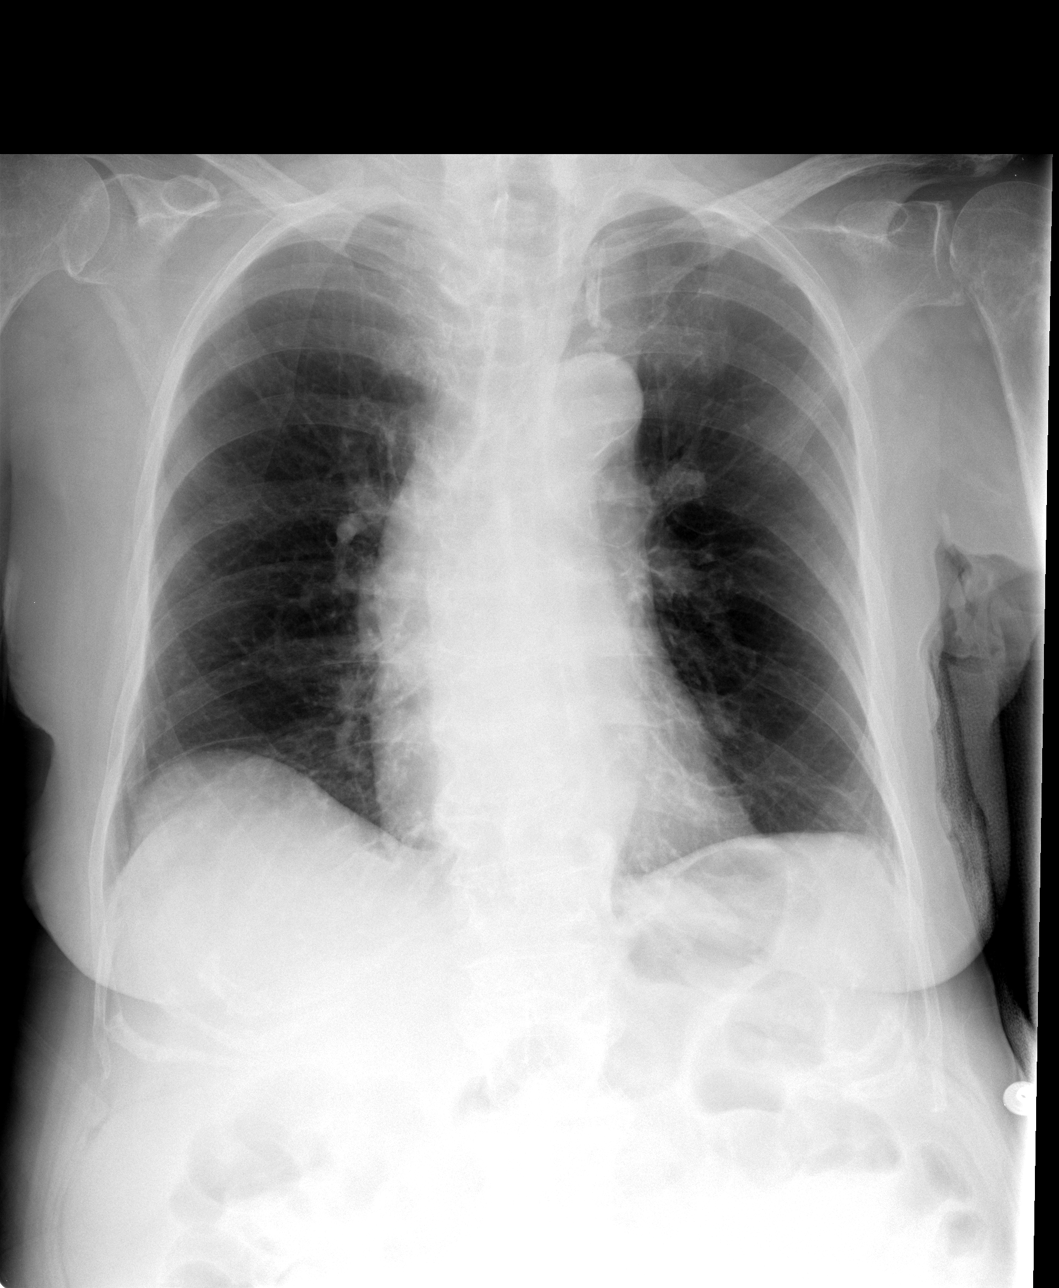

[view not recorded (2 of 3)]
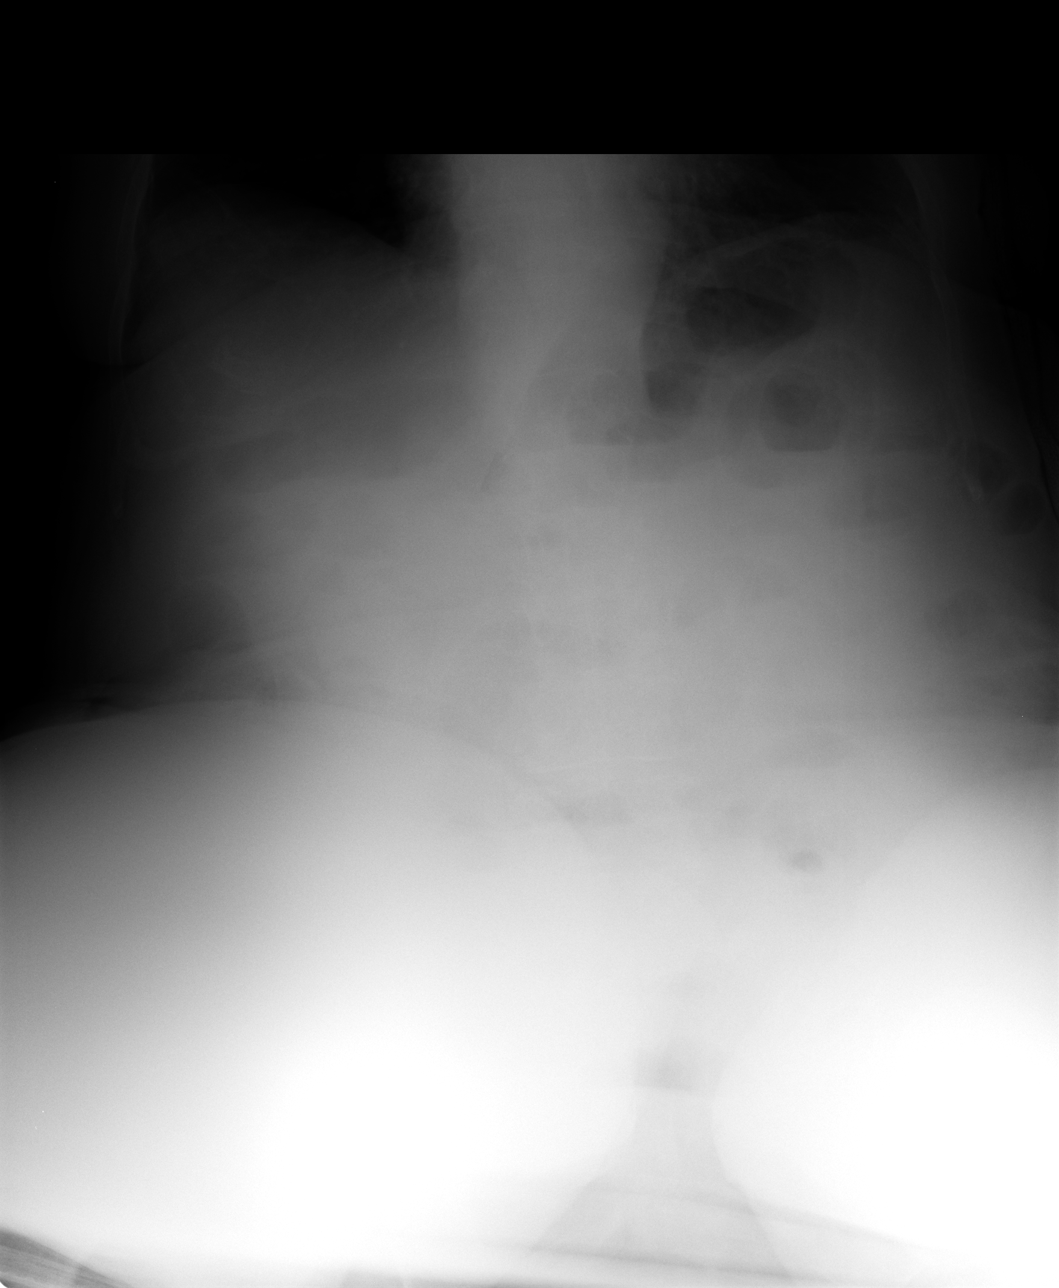

[view not recorded (3 of 3)]
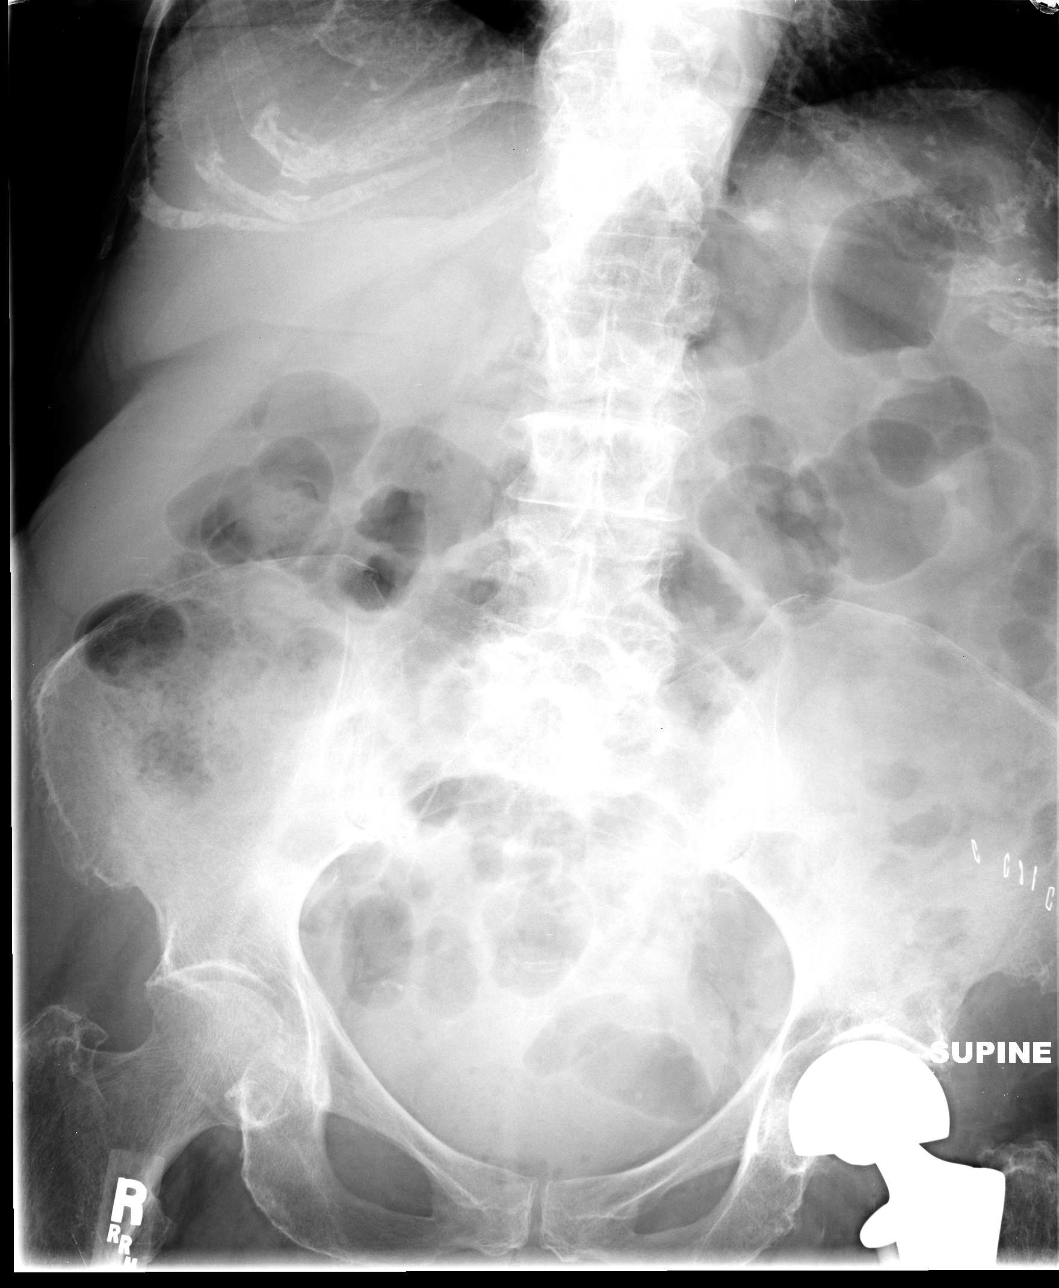

[3 of 3 positions shown; findings below may reference images not displayed]

FINDINGS: The cardiac silhouette, mediastinum, pulmonary
vasculature are within normal limits.  Both lungs are clear.
Emphysematous changes are noted.

There is no pneumoperitoneum.  The stool and bowel gas pattern is
within normal limits with no evidence of obstruction.  There is no
gross organomegaly.  No abnormal calcifications overlie the abdomen
or pelvis.  There are degenerative changes within the axial spine.
There is no acute bony abnormality.
IMPRESSION: There is no evidence of acute cardiac or pulmonary process.

There is no evidence of bowel obstruction or pneumoperitoneum.

## 2013-01-10 IMAGING — CR DG HIP (WITH OR WITHOUT PELVIS) 2-3V*L*
2 series · 2 of 2 positions shown · non-contrast
Comparison: 07/22/2011

CLINICAL DATA: Fall

LEFT HIP - COMPLETE 2+ VIEW

[view not recorded (1 of 2)]
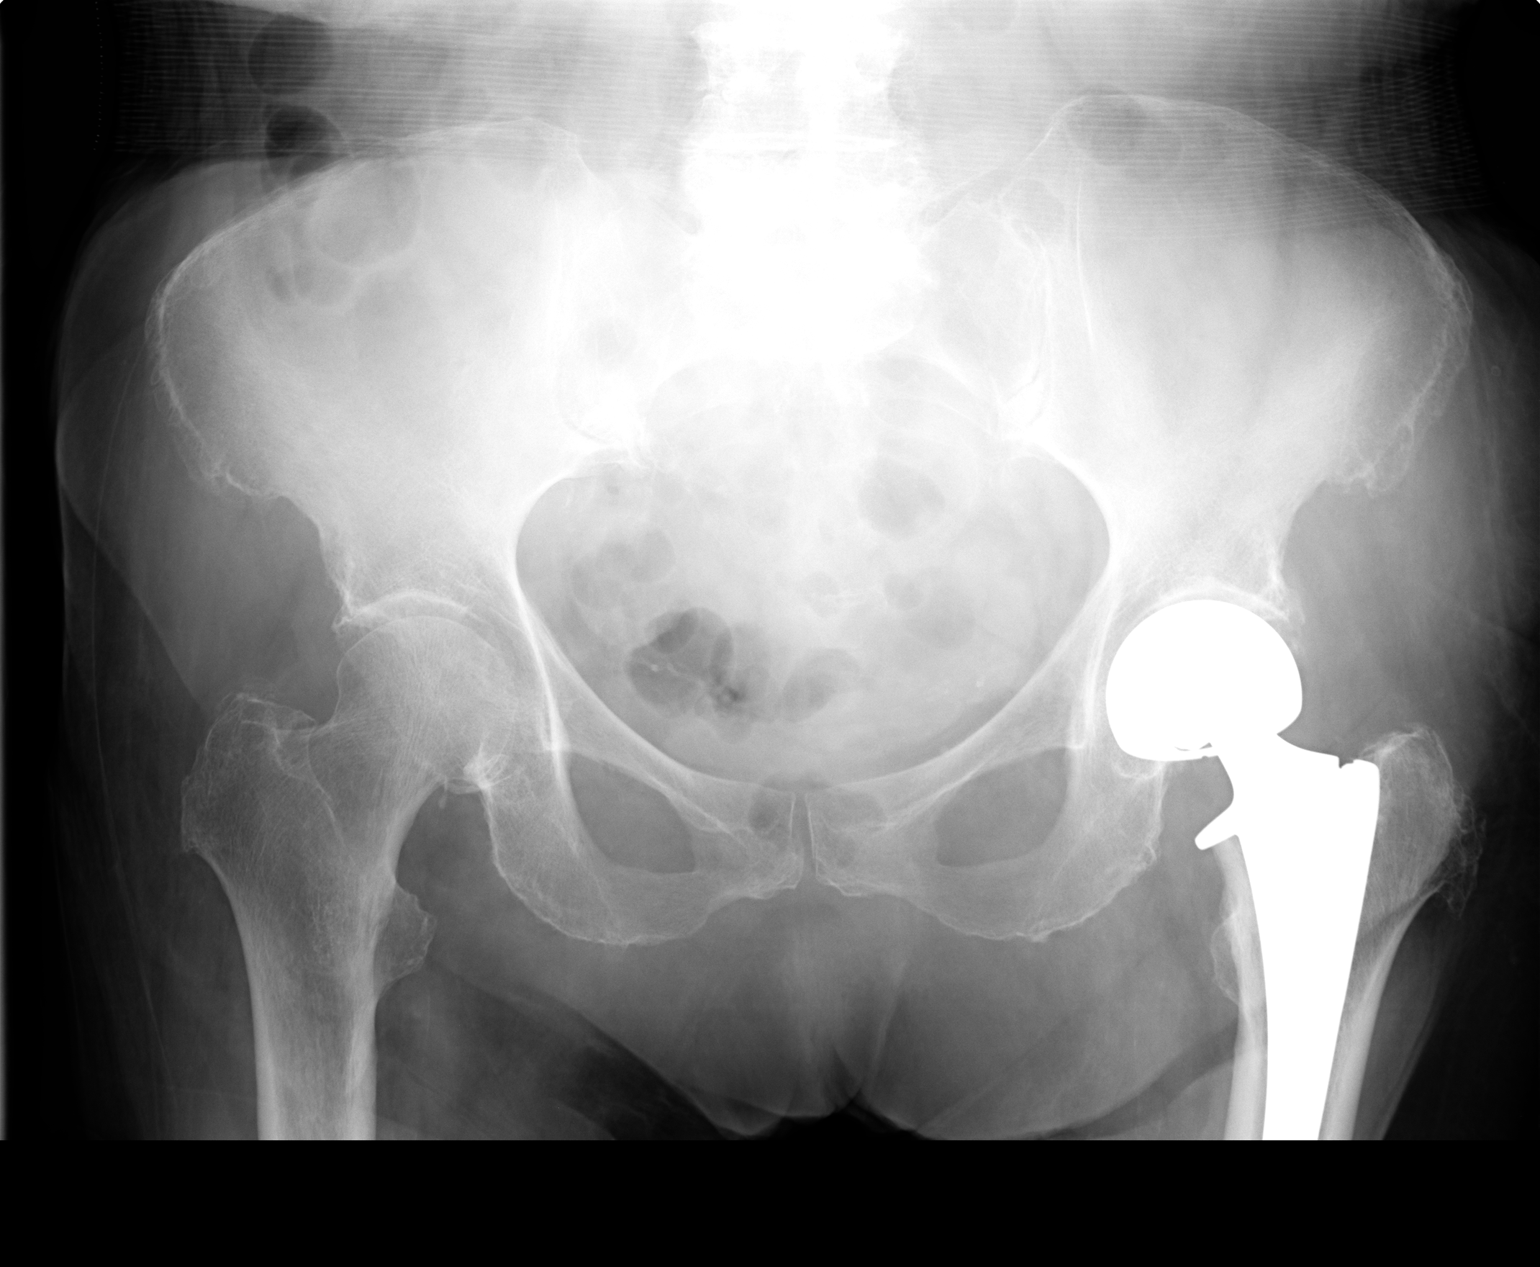

[view not recorded (2 of 2)]
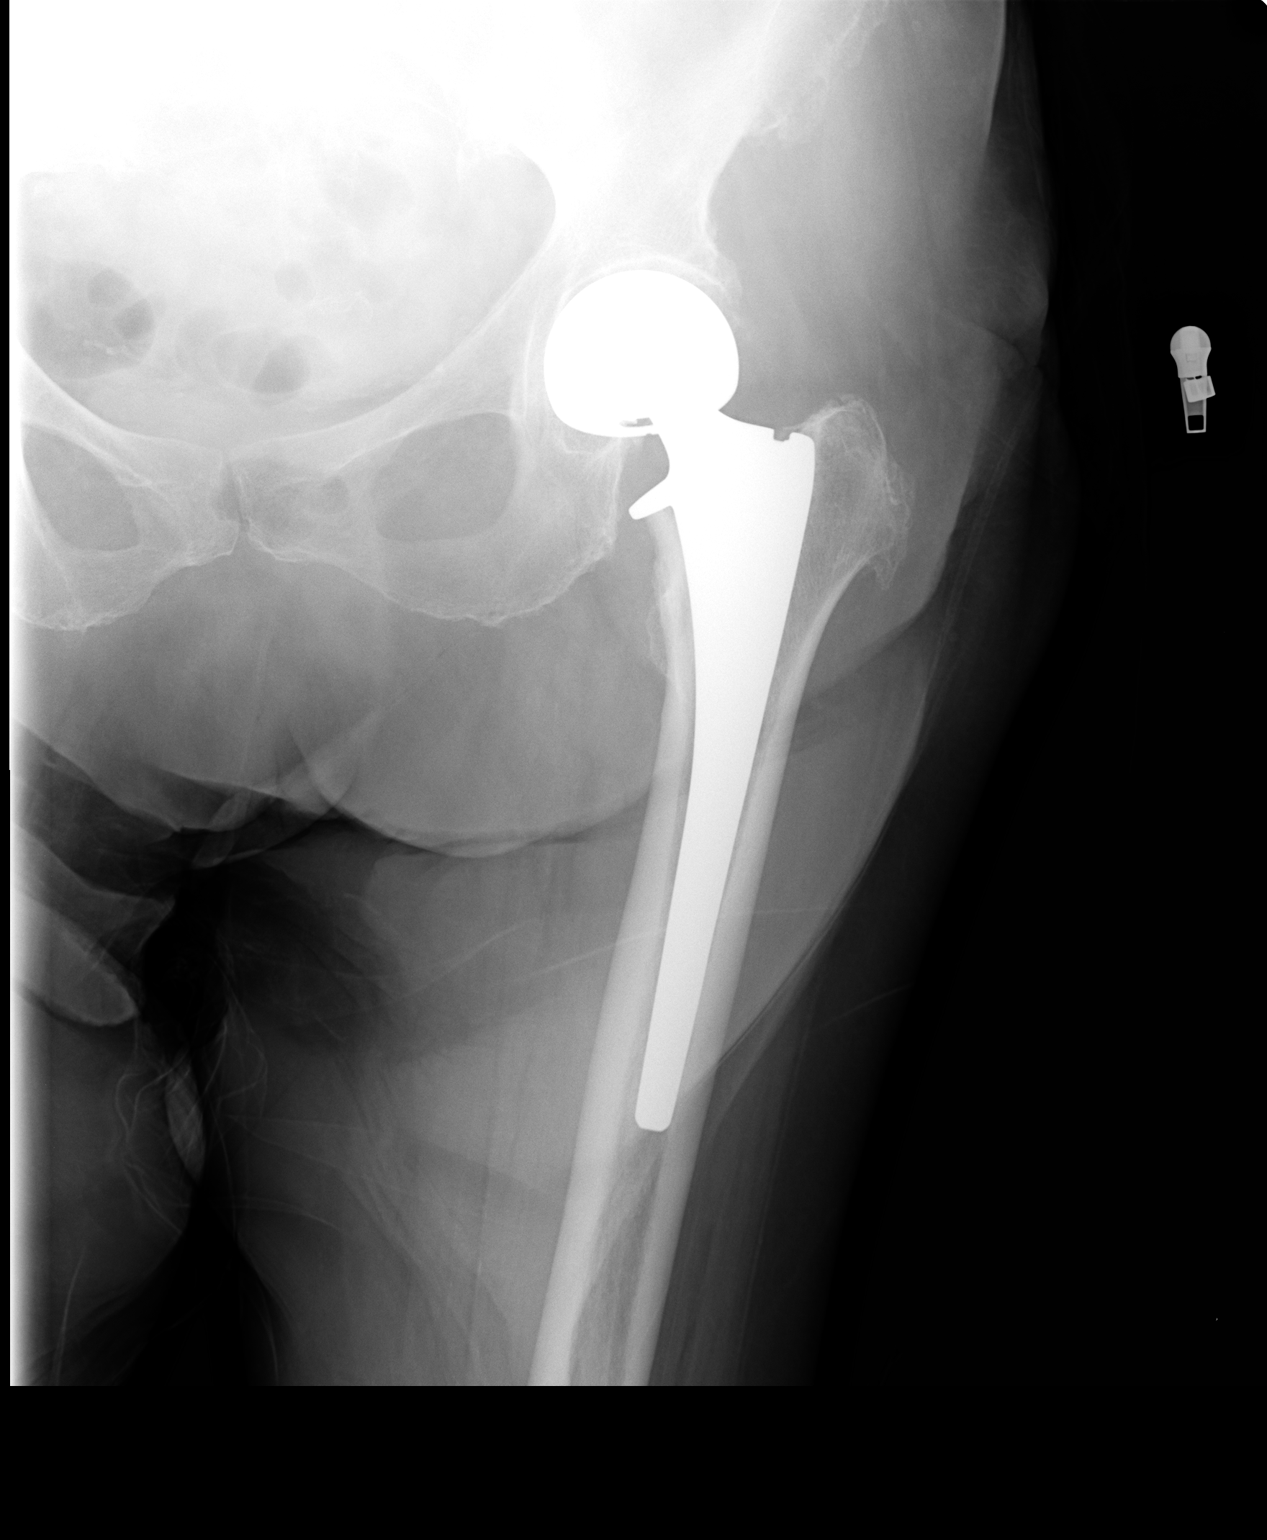

[2 of 2 positions shown; findings below may reference images not displayed]

FINDINGS: Left hip hemiarthroplasty is in place.  Anatomic
alignment of the osseous and prosthetic structures.  No breakage or
loosening of the hardware. No acute fracture.  Mild degenerative
change of the right hip joint.  Osteopenia.
IMPRESSION: No acute bony pathology.

## 2013-03-13 ENCOUNTER — Emergency Department (HOSPITAL_COMMUNITY)
Admission: EM | Admit: 2013-03-13 | Discharge: 2013-03-13 | Disposition: A | Discharge status: Skilled Nursing Facility | Payer: Medicare Other | Attending: Emergency Medicine | Admitting: Emergency Medicine

## 2013-03-13 ENCOUNTER — Encounter (HOSPITAL_COMMUNITY): Payer: Self-pay | Admitting: *Deleted

## 2013-03-13 ENCOUNTER — Emergency Department (HOSPITAL_COMMUNITY): Payer: Medicare Other

## 2013-03-13 DIAGNOSIS — Z8639 Personal history of other endocrine, nutritional and metabolic disease: Secondary | ICD-10-CM | POA: Insufficient documentation

## 2013-03-13 DIAGNOSIS — Z862 Personal history of diseases of the blood and blood-forming organs and certain disorders involving the immune mechanism: Secondary | ICD-10-CM | POA: Insufficient documentation

## 2013-03-13 DIAGNOSIS — Z794 Long term (current) use of insulin: Secondary | ICD-10-CM | POA: Insufficient documentation

## 2013-03-13 DIAGNOSIS — Z9861 Coronary angioplasty status: Secondary | ICD-10-CM | POA: Insufficient documentation

## 2013-03-13 DIAGNOSIS — Z8719 Personal history of other diseases of the digestive system: Secondary | ICD-10-CM | POA: Insufficient documentation

## 2013-03-13 DIAGNOSIS — N390 Urinary tract infection, site not specified: Secondary | ICD-10-CM | POA: Insufficient documentation

## 2013-03-13 DIAGNOSIS — Z79899 Other long term (current) drug therapy: Secondary | ICD-10-CM | POA: Insufficient documentation

## 2013-03-13 DIAGNOSIS — E119 Type 2 diabetes mellitus without complications: Secondary | ICD-10-CM | POA: Insufficient documentation

## 2013-03-13 DIAGNOSIS — Z8739 Personal history of other diseases of the musculoskeletal system and connective tissue: Secondary | ICD-10-CM | POA: Insufficient documentation

## 2013-03-13 DIAGNOSIS — M810 Age-related osteoporosis without current pathological fracture: Secondary | ICD-10-CM | POA: Insufficient documentation

## 2013-03-13 DIAGNOSIS — F0280 Dementia in other diseases classified elsewhere without behavioral disturbance: Secondary | ICD-10-CM | POA: Insufficient documentation

## 2013-03-13 DIAGNOSIS — I10 Essential (primary) hypertension: Secondary | ICD-10-CM | POA: Insufficient documentation

## 2013-03-13 DIAGNOSIS — Z880 Allergy status to penicillin: Secondary | ICD-10-CM | POA: Insufficient documentation

## 2013-03-13 DIAGNOSIS — G309 Alzheimer's disease, unspecified: Secondary | ICD-10-CM | POA: Insufficient documentation

## 2013-03-13 DIAGNOSIS — R1110 Vomiting, unspecified: Secondary | ICD-10-CM | POA: Diagnosis present

## 2013-03-13 LAB — URINALYSIS W MICROSCOPIC + REFLEX CULTURE
Bilirubin Urine: NEGATIVE
Nitrite: NEGATIVE
Specific Gravity, Urine: 1.027 (ref 1.005–1.030)
Urobilinogen, UA: 1 mg/dL (ref 0.0–1.0)

## 2013-03-13 LAB — COMPREHENSIVE METABOLIC PANEL
BUN: 17 mg/dL (ref 6–23)
CO2: 20 mEq/L (ref 19–32)
Calcium: 9.3 mg/dL (ref 8.4–10.5)
Creatinine, Ser: 0.5 mg/dL (ref 0.50–1.10)
GFR calc Af Amer: >90 mL/min (ref >90)
GFR calc non Af Amer: >90 mL/min (ref >90)
Glucose, Bld: 252 mg/dL — ABNORMAL HIGH (ref 70–99)
Total Protein: 6.4 g/dL (ref 6.0–8.3)

## 2013-03-13 LAB — CBC WITH DIFFERENTIAL/PLATELET
Eosinophils Absolute: 0 10*3/uL (ref 0.0–0.7)
Eosinophils Relative: 0 % (ref 0–5)
HCT: 38.6 % (ref 36.0–46.0)
Lymphocytes Relative: 4 % — ABNORMAL LOW (ref 12–46)
Lymphs Abs: 0.3 10*3/uL — ABNORMAL LOW (ref 0.7–4.0)
MCH: 32.4 pg (ref 26.0–34.0)
MCV: 93.9 fL (ref 78.0–100.0)
Monocytes Absolute: 0.4 10*3/uL (ref 0.1–1.0)
RBC: 4.11 MIL/uL (ref 3.87–5.11)
WBC: 8.4 10*3/uL (ref 4.0–10.5)

## 2013-03-13 MED ORDER — CIPROFLOXACIN HCL 500 MG PO TABS
250.0000 mg | ORAL_TABLET | ORAL | Status: AC
  Administered 2013-03-13: 250 mg via ORAL
  Filled 2013-03-13: qty 1

## 2013-03-13 MED ORDER — ONDANSETRON HCL 4 MG/2ML IJ SOLN
4.0000 mg | Freq: Once | INTRAMUSCULAR | Status: AC
  Administered 2013-03-13: 4 mg via INTRAVENOUS
  Filled 2013-03-13: qty 2

## 2013-03-13 MED ORDER — CIPROFLOXACIN HCL 250 MG PO TABS: 250.0000 mg | ORAL_TABLET | Freq: Two times a day (BID) | ORAL | Status: AC

## 2013-03-13 MED ORDER — SODIUM CHLORIDE 0.9 % IV BOLUS (SEPSIS)
1000.0000 mL | INTRAVENOUS | Status: AC
  Administered 2013-03-13: 1000 mL via INTRAVENOUS

## 2013-03-13 NOTE — ED Provider Notes (Signed)
CSN: [id number]     Arrival date & time 03/13/13  1306 History   First MD Initiated Contact with Patient 03/13/13 1316     Chief Complaint  Patient presents with  . Emesis   (Consider location/radiation/quality/duration/timing/severity/associated sxs/prior Treatment) Patient is a 79 y.o. female presenting with vomiting. The history is provided by the EMS personnel.  Emesis Severity:  Mild Duration:  8 hours Timing: once. Quality:  Stomach contents Progression:  Resolved Chronicity:  New Recent urination:  Normal Relieved by:  Nothing Worsened by:  Nothing tried Ineffective treatments:  None tried Associated symptoms: no abdominal pain, no diarrhea, no fever and no headaches     Past Medical History  Diagnosis Date  . IBS (irritable bowel syndrome)   . HTN (hypertension)   . Osteoporosis   . Hyperlipidemia   . Hyperkalemia   . Diabetes mellitus   . Alzheimer disease   . Arthritis    Past Surgical History  Procedure Laterality Date  . Abdominal hysterectomy    . Cardiac catheterization    . Hip arthroplasty  07/22/2011    Procedure: ARTHROPLASTY BIPOLAR HIP;  Surgeon: Darreld Mclean, MD;  Location: [facility];  Service: Orthopedics;  Laterality: Left;   No family history on file. History  Substance Use Topics  . Smoking status: Never Smoker   . Smokeless tobacco: Not on file  . Alcohol Use: No   OB History   Grav Para Term Preterm Abortions TAB SAB Ect Mult Living                 Review of Systems  Constitutional: Negative for fever and fatigue.  HENT: Negative for congestion, drooling and neck pain.   Eyes: Negative for pain.  Respiratory: Negative for cough and shortness of breath.   Cardiovascular: Negative for chest pain.  Gastrointestinal: Positive for vomiting. Negative for nausea, abdominal pain and diarrhea.  Genitourinary: Negative for dysuria and hematuria.  Musculoskeletal: Negative for back pain and gait problem.  Skin: Negative for color change.   Neurological: Negative for dizziness and headaches.  Hematological: Negative for adenopathy.  Psychiatric/Behavioral: Negative for behavioral problems.  All other systems reviewed and are negative.    Allergies  Penicillins and Quinine derivatives  Home Medications   Current Outpatient Rx  Name  Route  Sig  Dispense  Refill  . bimatoprost (LUMIGAN) 0.03 % ophthalmic solution   Both Eyes   Place 1 drop into both eyes at bedtime.           . cholecalciferol (VITAMIN D) 1000 UNITS tablet   Oral   Take 1,000 Units by mouth every morning.          . insulin aspart (NOVOLOG) 100 UNIT/ML injection   Subcutaneous   Inject into the skin as directed. Per sliding scale instructions: If <200=NO COVERAGE, 201-250= 2units, 251-300= 4units, 301-350= 6units, 351-400= 8units, 401-450= 10 units, **CALL MD WITH RESULTS <60 or >300**         . loperamide (IMODIUM) 2 MG capsule   Oral   Take 2 capsules (4 mg total) by mouth 3 (three) times daily as needed for diarrhea or loose stools. Loose stool   30 capsule      . rivastigmine (EXELON) 9.5 mg/24hr   Transdermal   Place 1 patch onto the skin daily. Remove used patches before applying a new patch          SpO2 98% Physical Exam  Nursing note and vitals reviewed. Constitutional: She appears well-developed  and well-nourished.  HENT:  Head: Normocephalic.  Mouth/Throat: No oropharyngeal exudate.  Eyes: Conjunctivae and EOM are normal. Pupils are equal, round, and reactive to light.  Neck: Normal range of motion. Neck supple.  Cardiovascular: Normal rate, regular rhythm, normal heart sounds and intact distal pulses.  Exam reveals no gallop and no friction rub.   No murmur heard. Pulmonary/Chest: Effort normal and breath sounds normal. No respiratory distress. She has no wheezes.  Abdominal: Soft. Bowel sounds are normal. There is no tenderness. There is no rebound and no guarding.  Musculoskeletal: Normal range of motion. She  exhibits no edema and no tenderness.  Neurological: She is alert.  A/o x 1. Moving all extremities. Will follow basic commands.   Skin: Skin is warm and dry.  Psychiatric: She has a normal mood and affect. Her behavior is normal.    ED Course  Procedures (including critical care time) Labs Review Labs Reviewed  CBC WITH DIFFERENTIAL - Abnormal; Notable for the following:    Neutrophils Relative % 92 (*)    Lymphocytes Relative 4 (*)    Lymphs Abs 0.3 (*)    All other components within normal limits  COMPREHENSIVE METABOLIC PANEL - Abnormal; Notable for the following:    Glucose, Bld 252 (*)    Albumin 3.2 (*)    All other components within normal limits  URINALYSIS W MICROSCOPIC + REFLEX CULTURE - Abnormal; Notable for the following:    APPearance CLOUDY (*)    Glucose, UA >1000 (*)    Hgb urine dipstick LARGE (*)    Ketones, ur 40 (*)    Leukocytes, UA TRACE (*)    All other components within normal limits   Imaging Review Dg Chest 2 View  03/13/2013   *RADIOLOGY REPORT*  Clinical Data: Rule out infection  CHEST - 2 VIEW  Comparison: 02/26/2012  Findings: The lungs remain clear.  Negative for pneumonia or effusion.  Negative for heart failure or edema.  IMPRESSION: No acute abnormality.   Original Report Authenticated By: Janeece Riggers, M.D.     Date: 03/13/2013  Rate: 92  Rhythm: normal sinus rhythm  QRS Axis: left  Intervals: QT prolonged  ST/T Wave abnormalities: normal  Conduction Disutrbances:none  Narrative Interpretation: borderline prolonged QT, LAD, early R/S transition, no ST or T wave changes c/w ischemia  Old EKG Reviewed: changes noted    MDM   1. Emesis   2. UTI (lower urinary tract infection)    1:31 PM 79 y.o. female sent from facility w/ small episode of emesis. Pt has no complaints here, abd is benign, A/o x1. Is AFVSS here. Will get screening labs, IVF, zofran.   8:27 PM: Pt continues to appear well. No emesis here. At baseline per daughter who is  at the bedside. Possible UTI, will tx. I have discussed the diagnosis/risks/treatment options with the family and believe the pt to be eligible for discharge home to follow-up with pcp as needed. We also discussed returning to the ED immediately if new or worsening sx occur. We discussed the sx which are most concerning (e.g., worsening vomiting, abd pain, fever) that necessitate immediate return. Any new prescriptions provided to the patient are listed below.  Discharge Medication List as of 03/13/2013  4:32 PM    START taking these medications   Details  ciprofloxacin (CIPRO) 250 MG tablet Take 1 tablet (250 mg total) by mouth every 12 (twelve) hours., Starting 03/13/2013, Last dose on Thu 03/16/13, Print  Junius Argyle, MD 03/13/13 2028

## 2013-03-13 NOTE — ED Notes (Signed)
Called [facility] to request more information, per med tech pt has been agitated/anxious and they thought she maybe in pain. Med tech stated pt was given xanax and tramadol but that did not help calm her down so they called EMS. Dr Romeo Apple aware.

## 2013-03-13 NOTE — ED Notes (Signed)
Bed: WA25 Expected date:  Expected time:  Means of arrival:  Comments: fall 

## 2013-03-13 NOTE — ED Notes (Signed)
Per EMS pt coming from [facility] nursing home where staff reported pt was not feeling well. EMS reports staff stated pt seems to be in pain and had an episode of small emesis this morning. VSS; hx of Alzheimers.

## 2013-04-20 ENCOUNTER — Emergency Department (HOSPITAL_COMMUNITY)
Admission: EM | Admit: 2013-04-20 | Discharge: 2013-04-20 | Disposition: A | Discharge status: Home / Self Care | Payer: Medicare Other | Attending: Emergency Medicine | Admitting: Emergency Medicine

## 2013-04-20 ENCOUNTER — Emergency Department (HOSPITAL_COMMUNITY): Payer: Medicare Other

## 2013-04-20 ENCOUNTER — Encounter (HOSPITAL_COMMUNITY): Payer: Self-pay | Admitting: Emergency Medicine

## 2013-04-20 DIAGNOSIS — S20229A Contusion of unspecified back wall of thorax, initial encounter: Secondary | ICD-10-CM | POA: Insufficient documentation

## 2013-04-20 DIAGNOSIS — X58XXXA Exposure to other specified factors, initial encounter: Secondary | ICD-10-CM | POA: Insufficient documentation

## 2013-04-20 DIAGNOSIS — Z8719 Personal history of other diseases of the digestive system: Secondary | ICD-10-CM | POA: Insufficient documentation

## 2013-04-20 DIAGNOSIS — E785 Hyperlipidemia, unspecified: Secondary | ICD-10-CM | POA: Insufficient documentation

## 2013-04-20 DIAGNOSIS — Z8739 Personal history of other diseases of the musculoskeletal system and connective tissue: Secondary | ICD-10-CM | POA: Insufficient documentation

## 2013-04-20 DIAGNOSIS — N390 Urinary tract infection, site not specified: Secondary | ICD-10-CM

## 2013-04-20 DIAGNOSIS — Z79899 Other long term (current) drug therapy: Secondary | ICD-10-CM | POA: Insufficient documentation

## 2013-04-20 DIAGNOSIS — F0280 Dementia in other diseases classified elsewhere without behavioral disturbance: Secondary | ICD-10-CM | POA: Insufficient documentation

## 2013-04-20 DIAGNOSIS — Z794 Long term (current) use of insulin: Secondary | ICD-10-CM | POA: Insufficient documentation

## 2013-04-20 DIAGNOSIS — I10 Essential (primary) hypertension: Secondary | ICD-10-CM | POA: Insufficient documentation

## 2013-04-20 DIAGNOSIS — T148XXA Other injury of unspecified body region, initial encounter: Secondary | ICD-10-CM

## 2013-04-20 DIAGNOSIS — Y9210 Unspecified residential institution as the place of occurrence of the external cause: Secondary | ICD-10-CM | POA: Insufficient documentation

## 2013-04-20 DIAGNOSIS — E875 Hyperkalemia: Secondary | ICD-10-CM | POA: Insufficient documentation

## 2013-04-20 DIAGNOSIS — G309 Alzheimer's disease, unspecified: Secondary | ICD-10-CM | POA: Insufficient documentation

## 2013-04-20 DIAGNOSIS — E119 Type 2 diabetes mellitus without complications: Secondary | ICD-10-CM | POA: Insufficient documentation

## 2013-04-20 DIAGNOSIS — Y939 Activity, unspecified: Secondary | ICD-10-CM | POA: Insufficient documentation

## 2013-04-20 LAB — URINALYSIS, ROUTINE W REFLEX MICROSCOPIC
Ketones, ur: 40 mg/dL — AB
Nitrite: NEGATIVE

## 2013-04-20 LAB — CBC
HCT: 38.4 % (ref 36.0–46.0)
MCHC: 34.6 g/dL (ref 30.0–36.0)
Platelets: 291 10*3/uL (ref 150–400)
RDW: 13.3 % (ref 11.5–15.5)

## 2013-04-20 LAB — URINE MICROSCOPIC-ADD ON

## 2013-04-20 MED ORDER — CIPROFLOXACIN HCL 500 MG PO TABS: 500.0000 mg | ORAL_TABLET | Freq: Two times a day (BID) | ORAL | Status: DC

## 2013-04-20 NOTE — ED Notes (Signed)
Per EMS-nursing staff found bruise on pt's back that was not there when she went to bed-bruise on right flank, states it "hurts", unable to rate-history of anxiety and dementia-was given xanax at 0530 this am-mental status at baseline per nursing home staff

## 2013-04-20 NOTE — ED Provider Notes (Signed)
CSN: [id number]     Arrival date & time 04/20/13  4098 History   First MD Initiated Contact with Patient 04/20/13 [phone]     Chief Complaint  Patient presents with  . bruise on back    (Consider location/radiation/quality/duration/timing/severity/associated sxs/prior Treatment) HPI  This is a 79 year old female with a history of Alzheimer's, hypertension, and hyperlipidemia who presents with a bruise. A shunt was noted by nursing staff at her facility to have a new bruise over her right flank.  This but report was not present last night. No known fall or injury. Patient is noncontributory to history taking secondary to dementia. She is disoriented. Per chart view, patient does not appear to be on any anticoagulants.   Past Medical History  Diagnosis Date  . IBS (irritable bowel syndrome)   . HTN (hypertension)   . Osteoporosis   . Hyperlipidemia   . Hyperkalemia   . Diabetes mellitus   . Alzheimer disease   . Arthritis    Past Surgical History  Procedure Laterality Date  . Abdominal hysterectomy    . Cardiac catheterization    . Hip arthroplasty  07/22/2011    Procedure: ARTHROPLASTY BIPOLAR HIP;  Surgeon: Darreld Mclean, MD;  Location: [facility];  Service: Orthopedics;  Laterality: Left;   No family history on file. History  Substance Use Topics  . Smoking status: Never Smoker   . Smokeless tobacco: Not on file  . Alcohol Use: No   OB History   Grav Para Term Preterm Abortions TAB SAB Ect Mult Living                 Review of Systems  Unable to perform ROS: Dementia    Allergies  Penicillins and Quinine derivatives  Home Medications   Current Outpatient Rx  Name  Route  Sig  Dispense  Refill  . acetaminophen (TYLENOL) 325 MG tablet   Oral   Take 650 mg by mouth 3 (three) times daily.         Marland Kitchen acetaminophen (TYLENOL) 500 MG tablet   Oral   Take 500 mg by mouth every 6 (six) hours as needed for pain (fever >101, minor discomfort).          Marland Kitchen albuterol  (ACCUNEB) 0.63 MG/3ML nebulizer solution   Nebulization   Take 1 ampule by nebulization 4 (four) times daily.         Marland Kitchen ALPRAZolam (XANAX XR) 0.5 MG 24 hr tablet   Oral   Take 0.5 mg by mouth every morning.         Marland Kitchen ALPRAZolam (XANAX) 0.25 MG tablet   Oral   Take 0.25 mg by mouth every 8 (eight) hours as needed for anxiety. Not to exceed 3mg  in 24hr         . alum & mag hydroxide-simeth (MAALOX/MYLANTA) 200-200-20 MG/5ML suspension   Oral   Take 30 mLs by mouth every 6 (six) hours as needed for indigestion.         . bimatoprost (LUMIGAN) 0.01 % SOLN   Ophthalmic   Apply 1 drop to eye at bedtime. 1 drop in each eye at bedtime         . cholecalciferol (VITAMIN D) 1000 UNITS tablet   Oral   Take 1,000 Units by mouth every morning.          Marland Kitchen Dextromethorphan-Guaifenesin (SILTUSSIN DM ALCOHOL FREE) 10-100 MG/5ML liquid   Oral   Take 10 mLs by mouth every 4 (four) hours as  needed (Cough).         . divalproex (DEPAKOTE ER) 250 MG 24 hr tablet   Oral   Take 250 mg by mouth 2 (two) times daily.         Marland Kitchen escitalopram (LEXAPRO) 20 MG tablet   Oral   Take 20 mg by mouth daily.         . feeding supplement (PRO-STAT SUGAR FREE 64) LIQD   Oral   Take 30 mLs by mouth 3 (three) times daily with meals.         Marland Kitchen guaifenesin (ROBITUSSIN) 100 MG/5ML syrup   Oral   Take 200 mg by mouth 4 (four) times daily as needed for cough.          . hydroxypropyl methylcellulose (ISOPTO TEARS) 2.5 % ophthalmic solution   Right Eye   Place 1 drop into the right eye 3 (three) times daily.         Marland Kitchen ipratropium (ATROVENT) 0.02 % nebulizer solution   Nebulization   Take 500 mcg by nebulization every 6 (six) hours.         Marland Kitchen loperamide (IMODIUM) 2 MG capsule   Oral   Take 2 mg by mouth 4 (four) times daily as needed for diarrhea or loose stools. Not to exceed 8 doses in 24hr         . magnesium hydroxide (MILK OF MAGNESIA) 400 MG/5ML suspension   Oral   Take 30  mLs by mouth at bedtime as needed for constipation.         . metFORMIN (GLUCOPHAGE) 500 MG tablet   Oral   Take 500 mg by mouth 2 (two) times daily with a meal.         . omeprazole (PRILOSEC) 40 MG capsule   Oral   Take 40 mg by mouth daily.         . Probiotic Product (RISA-BID PROBIOTIC PO)   Oral   Take 1 tablet by mouth daily.         . promethazine (PHENERGAN) 12.5 MG tablet   Oral   Take 12.5 mg by mouth every 6 (six) hours as needed for nausea.         . rivastigmine (EXELON) 9.5 mg/24hr   Transdermal   Place 1 patch onto the skin daily. Remove used patches before applying a new patch         . traMADol (ULTRAM) 50 MG tablet   Oral   Take 50 mg by mouth 2 (two) times daily.         . traMADol (ULTRAM) 50 MG tablet   Oral   Take 50 mg by mouth every 6 (six) hours as needed for pain.         . ciprofloxacin (CIPRO) 500 MG tablet   Oral   Take 1 tablet (500 mg total) by mouth every 12 (twelve) hours.   6 tablet   0   . insulin aspart (NOVOLOG) 100 UNIT/ML injection   Subcutaneous   Inject 10 Units into the skin 2 (two) times daily as needed. Only if sugar is over 350 Per sliding scale instructions: If <200=NO COVERAGE, 201-250= 2units, 251-300= 4units, 301-350= 6units, 351-400= 8units, 401-450= 10 units, **CALL MD WITH RESULTS <60 or >300**          BP 141/65  Pulse 73  Temp(Src) 98 F (36.7 C) (Oral)  Resp 14  SpO2 99% Physical Exam  Nursing note and vitals reviewed. Constitutional:  Elderly, no acute distress  HENT:  Head: Normocephalic.  Right Ear: External ear normal.  Left Ear: External ear normal.  Mouth/Throat: Oropharynx is clear and moist.  Patient has a small abrasion over her left forehead  Eyes: Pupils are equal, round, and reactive to light.  Neck: Neck supple.  Cardiovascular: Normal rate, regular rhythm and normal heart sounds.   No murmur heard. Pulmonary/Chest: Effort normal and breath sounds normal. No respiratory  distress. She has no wheezes.  Abdominal: Soft. Bowel sounds are normal. There is no tenderness.  Musculoskeletal:  There is a 6 cm area of ecchymosis with overlying abrasion over the right flank and posterior chest wall. Tenderness to palpation.  Neurological: She is alert.  Disoriented, moves all 4 extremities  Skin: Skin is warm and dry.  Skin findings as above  Psychiatric:  Dementia    ED Course  Procedures (including critical care time) Labs Review Labs Reviewed  URINALYSIS, ROUTINE W REFLEX MICROSCOPIC - Abnormal; Notable for the following:    Hgb urine dipstick SMALL (*)    Ketones, ur 40 (*)    Leukocytes, UA MODERATE (*)    All other components within normal limits  URINE CULTURE  CBC  URINE MICROSCOPIC-ADD ON   Imaging Review Dg Chest 2 View  04/20/2013   CLINICAL DATA:  Confusion  EXAM: CHEST  2 VIEW  COMPARISON:  03/13/2013  FINDINGS: The heart size and mediastinal contours are within normal limits. Both lungs are clear. The visualized skeletal structures are unremarkable.  IMPRESSION: No active cardiopulmonary disease.   Electronically Signed   By: Alcide Clever M.D.   On: 04/20/2013 08:35    MDM   1. Contusion   2. Urinary tract infection     79 year old who presents with bruising on the back. She is nontoxic-appearing and vital signs are within normal limits. Given the overlying abrasion, bruising is likely traumatic. Screening CBC shows stable hemoglobin.  The patient is not on any anticoagulants. Low suspicion for retroperitoneal hematoma. Chest x-ray normal. Urine also obtained given suspected fall to rule out UTI.  Urinalysis is nitrite negative but with 21-50 white blood cells and is accurate. We'll treat empirically for UTI. Patient will be discharged home with Cipro. At this time I do not feel she needs further imaging and her vital signs have remained stable in the ER.  After history, exam, and medical workup I feel the patient has been appropriately  medically screened and is safe for discharge home. Pertinent diagnoses were discussed with the patient. Patient was given return precautions.    Shon Baton, MD 04/20/13 [phone]

## 2013-04-20 NOTE — ED Notes (Signed)
Bed: ZO10 Expected date:  Expected time:  Means of arrival:  Comments: EMS 80yo F, bruising to back, unknown if injury

## 2013-04-21 LAB — URINE CULTURE: Colony Count: 30000

## 2013-05-24 ENCOUNTER — Emergency Department (HOSPITAL_COMMUNITY)
Admission: EM | Admit: 2013-05-24 | Discharge: 2013-05-24 | Disposition: A | Discharge status: Skilled Nursing Facility | Payer: Medicare Other | Attending: Emergency Medicine | Admitting: Emergency Medicine

## 2013-05-24 ENCOUNTER — Encounter (HOSPITAL_COMMUNITY): Payer: Self-pay | Admitting: Emergency Medicine

## 2013-05-24 ENCOUNTER — Emergency Department (HOSPITAL_COMMUNITY): Payer: Medicare Other

## 2013-05-24 DIAGNOSIS — I10 Essential (primary) hypertension: Secondary | ICD-10-CM | POA: Insufficient documentation

## 2013-05-24 DIAGNOSIS — Z79899 Other long term (current) drug therapy: Secondary | ICD-10-CM | POA: Insufficient documentation

## 2013-05-24 DIAGNOSIS — E119 Type 2 diabetes mellitus without complications: Secondary | ICD-10-CM | POA: Insufficient documentation

## 2013-05-24 DIAGNOSIS — G309 Alzheimer's disease, unspecified: Secondary | ICD-10-CM | POA: Insufficient documentation

## 2013-05-24 DIAGNOSIS — M810 Age-related osteoporosis without current pathological fracture: Secondary | ICD-10-CM | POA: Insufficient documentation

## 2013-05-24 DIAGNOSIS — Y939 Activity, unspecified: Secondary | ICD-10-CM | POA: Insufficient documentation

## 2013-05-24 DIAGNOSIS — S79919A Unspecified injury of unspecified hip, initial encounter: Secondary | ICD-10-CM | POA: Insufficient documentation

## 2013-05-24 DIAGNOSIS — Z8781 Personal history of (healed) traumatic fracture: Secondary | ICD-10-CM | POA: Insufficient documentation

## 2013-05-24 DIAGNOSIS — Z96649 Presence of unspecified artificial hip joint: Secondary | ICD-10-CM | POA: Insufficient documentation

## 2013-05-24 DIAGNOSIS — Y9210 Unspecified residential institution as the place of occurrence of the external cause: Secondary | ICD-10-CM | POA: Insufficient documentation

## 2013-05-24 DIAGNOSIS — M129 Arthropathy, unspecified: Secondary | ICD-10-CM | POA: Insufficient documentation

## 2013-05-24 DIAGNOSIS — Z8719 Personal history of other diseases of the digestive system: Secondary | ICD-10-CM | POA: Insufficient documentation

## 2013-05-24 DIAGNOSIS — Z794 Long term (current) use of insulin: Secondary | ICD-10-CM | POA: Insufficient documentation

## 2013-05-24 DIAGNOSIS — F0280 Dementia in other diseases classified elsewhere without behavioral disturbance: Secondary | ICD-10-CM | POA: Insufficient documentation

## 2013-05-24 DIAGNOSIS — W19XXXA Unspecified fall, initial encounter: Secondary | ICD-10-CM | POA: Insufficient documentation

## 2013-05-24 DIAGNOSIS — Z9861 Coronary angioplasty status: Secondary | ICD-10-CM | POA: Insufficient documentation

## 2013-05-24 NOTE — ED Notes (Signed)
PTAR as arrived

## 2013-05-24 NOTE — ED Notes (Signed)
Found on floor by staff at [facility]. Was lying on a pallet on the floor with blankets and pillows. No obvious injury. Alert to her baseline at present. Hx of falls. Sent due to NH protocol.

## 2013-05-24 NOTE — ED Provider Notes (Signed)
CSN: [id number]     Arrival date & time 05/24/13  1457 History   First MD Initiated Contact with Patient 05/24/13 1508     No chief complaint on file.  (Consider location/radiation/quality/duration/timing/severity/associated sxs/prior Treatment) HPI Patient presents from her nursing facility after a possible fall.  On my exam the patient denies complaints.  She is however, disorientation, limiting the history of present illness.  Per reports the patient was found on the floor, couple of blankets and pillows, with no obvious injury.  Per EMS the patient was clinically stable en route, with no complaints. 05 caveat secondary to dementia. Past Medical History  Diagnosis Date  . IBS (irritable bowel syndrome)   . HTN (hypertension)   . Osteoporosis   . Hyperlipidemia   . Hyperkalemia   . Diabetes mellitus   . Alzheimer disease   . Arthritis    Past Surgical History  Procedure Laterality Date  . Abdominal hysterectomy    . Cardiac catheterization    . Hip arthroplasty  07/22/2011    Procedure: ARTHROPLASTY BIPOLAR HIP;  Surgeon: Darreld Mclean, MD;  Location: [facility];  Service: Orthopedics;  Laterality: Left;   No family history on file. History  Substance Use Topics  . Smoking status: Never Smoker   . Smokeless tobacco: Not on file  . Alcohol Use: No   OB History   Grav Para Term Preterm Abortions TAB SAB Ect Mult Living                 Review of Systems  Unable to perform ROS: Dementia    Allergies  Penicillins and Quinine derivatives  Home Medications   Current Outpatient Rx  Name  Route  Sig  Dispense  Refill  . acetaminophen (TYLENOL) 500 MG tablet   Oral   Take 500 mg by mouth every 4 (four) hours as needed for mild pain, fever or headache (for fever over 101 degress, headache or minor discomfort).         Marland Kitchen albuterol (ACCUNEB) 0.63 MG/3ML nebulizer solution   Nebulization   Take 1 ampule by nebulization 2 (two) times daily.          Marland Kitchen ALPRAZolam (XANAX)  0.25 MG tablet   Oral   Take 0.25 mg by mouth every 8 (eight) hours as needed for anxiety.         Marland Kitchen alum & mag hydroxide-simeth (MAALOX/MYLANTA) 200-200-20 MG/5ML suspension   Oral   Take 30 mLs by mouth every 6 (six) hours as needed for indigestion.         . Artificial Tear Solution (SYSTANE CONTACTS) SOLN   Ophthalmic   Apply 1 drop to eye 3 (three) times daily. Right eye         . bimatoprost (LUMIGAN) 0.01 % SOLN   Both Eyes   Place 1 drop into both eyes at bedtime.          . cholecalciferol (VITAMIN D) 1000 UNITS tablet   Oral   Take 1,000 Units by mouth every morning.          . escitalopram (LEXAPRO) 20 MG tablet   Oral   Take 20 mg by mouth daily.         . feeding supplement (PRO-STAT SUGAR FREE 64) LIQD   Oral   Take 30 mLs by mouth 3 (three) times daily with meals.         . gabapentin (NEURONTIN) 100 MG capsule   Oral   Take 100 mg  by mouth 3 (three) times daily.         . insulin aspart (NOVOLOG) 100 UNIT/ML injection   Subcutaneous   Inject 10 Units into the skin 2 (two) times daily as needed. Only if sugar is over 350 Per sliding scale instructions: If <200=NO COVERAGE, 201-250= 2units, 251-300= 4units, 301-350= 6units, 351-400= 8units, 401-450= 10 units, **CALL MD WITH RESULTS <60 or >300**         . ipratropium (ATROVENT) 0.02 % nebulizer solution   Nebulization   Take 500 mcg by nebulization every 6 (six) hours.         Marland Kitchen loperamide (IMODIUM) 2 MG capsule   Oral   Take 2 mg by mouth 4 (four) times daily as needed for diarrhea or loose stools. Not to exceed 8 doses in 24hr         . LORazepam (ATIVAN) 0.5 MG tablet   Oral   Take 0.25 mg by mouth 4 (four) times daily.         . magnesium hydroxide (MILK OF MAGNESIA) 400 MG/5ML suspension   Oral   Take 30 mLs by mouth at bedtime as needed for constipation.         . metFORMIN (GLUCOPHAGE) 500 MG tablet   Oral   Take 250 mg by mouth 2 (two) times daily with a meal.           . omeprazole (PRILOSEC) 40 MG capsule   Oral   Take 40 mg by mouth daily.         . Probiotic Product (RISA-BID PROBIOTIC PO)   Oral   Take 1 tablet by mouth daily.         . promethazine (PHENERGAN) 12.5 MG tablet   Oral   Take 12.5 mg by mouth every 6 (six) hours as needed for nausea.         . rivastigmine (EXELON) 9.5 mg/24hr   Transdermal   Place 1 patch onto the skin daily. Remove used patches before applying a new patch         . traMADol (ULTRAM) 50 MG tablet   Oral   Take 50 mg by mouth 2 (two) times daily.          Marland Kitchen Dextromethorphan-Guaifenesin (SILTUSSIN DM ALCOHOL FREE) 10-100 MG/5ML liquid   Oral   Take 10 mLs by mouth every 4 (four) hours as needed (Cough).         Marland Kitchen guaifenesin (ROBITUSSIN) 100 MG/5ML syrup   Oral   Take 200 mg by mouth every 6 (six) hours as needed for cough.          . traMADol (ULTRAM) 50 MG tablet   Oral   Take 50 mg by mouth every 6 (six) hours as needed for moderate pain.           BP 110/70  Pulse 73  Temp(Src) 97.6 F (36.4 C) (Oral)  Resp 19  SpO2 99% Physical Exam  Nursing note and vitals reviewed. Constitutional: She is oriented to person, place, and time. She appears well-developed and well-nourished. No distress.  HENT:  Head: Normocephalic and atraumatic.  Eyes: Conjunctivae and EOM are normal.  Cardiovascular: Normal rate and regular rhythm.   Pulmonary/Chest: Effort normal and breath sounds normal. No stridor. No respiratory distress.  Abdominal: She exhibits no distension.  Musculoskeletal: She exhibits no edema.  No gross deformities.  There is minimal tenderness to palpation about the left lateral hip, but the patient states his typical.  Patient can flex and extend both hips symmetrically.  Pelvis is stable.   Neurological: She is alert and oriented to person, place, and time. No cranial nerve deficit.  Skin: Skin is warm and dry.  Psychiatric: She has a normal mood and affect. Her  speech is delayed and tangential. She is slowed and withdrawn. Cognition and memory are impaired. She is inattentive.    ED Course  Procedures (including critical care time) Labs Review Labs Reviewed - No data to display Imaging Review Dg Hip Complete Left  05/24/2013   CLINICAL DATA:  Fall.  EXAM: LEFT HIP - COMPLETE 2+ VIEW  COMPARISON:  02/04/2012.  FINDINGS: Diffuse osteopenia. Degenerative changes of the lumbar spine and right hip. Left hip prosthesis remains in stable position with stable appearance. No fracture.  IMPRESSION: No acute abnormality.  Diffuse osteopenia.  Left hip replacement.   Electronically Signed   By: Maisie Fus  Register   On: 05/24/2013 15:57    EKG Interpretation   None       MDM   1. Fall, initial encounter    This patient with history of prior hip fracture presents following a possible fall a nursing facility.  She is hemodynamically stable, and in no distress, with no complaints.  However, given the minimal tenderness to palpation about the left lateral hip, in spite of the fact that the patient was appropriately, and is otherwise neurovascular stable, x-ray was performed.  This was unremarkable.  Patient was discharged in stable condition to her nursing facility.    Gerhard Munch, MD 05/24/13 [phone]

## 2013-05-24 NOTE — ED Notes (Signed)
Bed: WA06 Expected date:  Expected time:  Means of arrival:  Comments: Unsure if pt fell, no complaints

## 2013-05-24 NOTE — ED Notes (Signed)
Report given to [facility]

## 2013-05-24 NOTE — ED Notes (Signed)
[facility] called from transportation

## 2013-06-04 ENCOUNTER — Encounter (HOSPITAL_COMMUNITY): Payer: Self-pay | Admitting: Emergency Medicine

## 2013-06-04 ENCOUNTER — Emergency Department (HOSPITAL_COMMUNITY)
Admission: EM | Admit: 2013-06-04 | Discharge: 2013-06-04 | Disposition: A | Discharge status: Skilled Nursing Facility | Payer: Medicare Other | Attending: Emergency Medicine | Admitting: Emergency Medicine

## 2013-06-04 DIAGNOSIS — Y9210 Unspecified residential institution as the place of occurrence of the external cause: Secondary | ICD-10-CM | POA: Insufficient documentation

## 2013-06-04 DIAGNOSIS — M129 Arthropathy, unspecified: Secondary | ICD-10-CM | POA: Insufficient documentation

## 2013-06-04 DIAGNOSIS — Z880 Allergy status to penicillin: Secondary | ICD-10-CM | POA: Insufficient documentation

## 2013-06-04 DIAGNOSIS — I10 Essential (primary) hypertension: Secondary | ICD-10-CM | POA: Insufficient documentation

## 2013-06-04 DIAGNOSIS — Z792 Long term (current) use of antibiotics: Secondary | ICD-10-CM | POA: Insufficient documentation

## 2013-06-04 DIAGNOSIS — W19XXXA Unspecified fall, initial encounter: Secondary | ICD-10-CM | POA: Insufficient documentation

## 2013-06-04 DIAGNOSIS — S8990XA Unspecified injury of unspecified lower leg, initial encounter: Secondary | ICD-10-CM | POA: Insufficient documentation

## 2013-06-04 DIAGNOSIS — Z794 Long term (current) use of insulin: Secondary | ICD-10-CM | POA: Insufficient documentation

## 2013-06-04 DIAGNOSIS — M810 Age-related osteoporosis without current pathological fracture: Secondary | ICD-10-CM | POA: Insufficient documentation

## 2013-06-04 DIAGNOSIS — E119 Type 2 diabetes mellitus without complications: Secondary | ICD-10-CM | POA: Insufficient documentation

## 2013-06-04 DIAGNOSIS — IMO0002 Reserved for concepts with insufficient information to code with codable children: Secondary | ICD-10-CM | POA: Insufficient documentation

## 2013-06-04 DIAGNOSIS — E785 Hyperlipidemia, unspecified: Secondary | ICD-10-CM | POA: Insufficient documentation

## 2013-06-04 DIAGNOSIS — Z95818 Presence of other cardiac implants and grafts: Secondary | ICD-10-CM | POA: Insufficient documentation

## 2013-06-04 DIAGNOSIS — K589 Irritable bowel syndrome without diarrhea: Secondary | ICD-10-CM | POA: Insufficient documentation

## 2013-06-04 DIAGNOSIS — F411 Generalized anxiety disorder: Secondary | ICD-10-CM | POA: Insufficient documentation

## 2013-06-04 DIAGNOSIS — G309 Alzheimer's disease, unspecified: Secondary | ICD-10-CM | POA: Insufficient documentation

## 2013-06-04 DIAGNOSIS — Y9389 Activity, other specified: Secondary | ICD-10-CM | POA: Insufficient documentation

## 2013-06-04 DIAGNOSIS — F0280 Dementia in other diseases classified elsewhere without behavioral disturbance: Secondary | ICD-10-CM | POA: Insufficient documentation

## 2013-06-04 DIAGNOSIS — Z79899 Other long term (current) drug therapy: Secondary | ICD-10-CM | POA: Insufficient documentation

## 2013-06-04 NOTE — ED Notes (Addendum)
Pt moved to chair after several attempts at trying to get out of bed. Pt has non- skid footwear on. PA/MD notified that pt will not stay in bed or chair.

## 2013-06-04 NOTE — ED Notes (Addendum)
[facility] called to pick up pt  

## 2013-06-04 NOTE — ED Notes (Signed)
Pt states that she does not know why she is here. Pt confused and seems anxious. Pt in NAD
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
## 2013-06-04 NOTE — ED Notes (Addendum)
Pt was found lying down on R side on the floor. Of the few staff members outside the room, no noise/impact heard from inside the room . Pt was helped to feet, placed into a chair. Pt denied pain or injury upon assessment. PA notified.

## 2013-06-04 NOTE — ED Notes (Signed)
Pt attempting to get out of bed several times. Given warm blankets.

## 2013-06-04 NOTE — ED Notes (Addendum)
Pt from [facility] via [place] [facility] fall onto hardwood floors. Per EMS, fall was un witnessed. Pt c/o L knee pain, lower back pain on scene, but during transport, stated that she was not in pain. Pt has hx of advanced dementia and does not remember falling.

## 2013-06-04 NOTE — ED Provider Notes (Signed)
See prior note  Ward Givens, MD 06/04/13 1308

## 2013-06-04 NOTE — ED Notes (Signed)
Bed: WA09 Expected date:  Expected time:  Means of arrival:  Comments: ems- unwittnessed fall, dementia pt
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
## 2013-06-15 ENCOUNTER — Encounter (HOSPITAL_COMMUNITY): Payer: Self-pay | Admitting: Emergency Medicine

## 2013-06-15 ENCOUNTER — Emergency Department (HOSPITAL_COMMUNITY): Payer: Medicare Other

## 2013-06-15 ENCOUNTER — Emergency Department (HOSPITAL_COMMUNITY)
Admission: EM | Admit: 2013-06-15 | Discharge: 2013-06-15 | Disposition: A | Discharge status: Skilled Nursing Facility | Payer: Medicare Other | Attending: Emergency Medicine | Admitting: Emergency Medicine

## 2013-06-15 DIAGNOSIS — Y939 Activity, unspecified: Secondary | ICD-10-CM | POA: Insufficient documentation

## 2013-06-15 DIAGNOSIS — Z8719 Personal history of other diseases of the digestive system: Secondary | ICD-10-CM | POA: Insufficient documentation

## 2013-06-15 DIAGNOSIS — G309 Alzheimer's disease, unspecified: Secondary | ICD-10-CM | POA: Insufficient documentation

## 2013-06-15 DIAGNOSIS — M810 Age-related osteoporosis without current pathological fracture: Secondary | ICD-10-CM | POA: Insufficient documentation

## 2013-06-15 DIAGNOSIS — X58XXXA Exposure to other specified factors, initial encounter: Secondary | ICD-10-CM | POA: Insufficient documentation

## 2013-06-15 DIAGNOSIS — E119 Type 2 diabetes mellitus without complications: Secondary | ICD-10-CM | POA: Insufficient documentation

## 2013-06-15 DIAGNOSIS — T148XXA Other injury of unspecified body region, initial encounter: Secondary | ICD-10-CM | POA: Diagnosis present

## 2013-06-15 DIAGNOSIS — Z880 Allergy status to penicillin: Secondary | ICD-10-CM | POA: Insufficient documentation

## 2013-06-15 DIAGNOSIS — I10 Essential (primary) hypertension: Secondary | ICD-10-CM | POA: Insufficient documentation

## 2013-06-15 DIAGNOSIS — M129 Arthropathy, unspecified: Secondary | ICD-10-CM | POA: Insufficient documentation

## 2013-06-15 DIAGNOSIS — Z79899 Other long term (current) drug therapy: Secondary | ICD-10-CM | POA: Insufficient documentation

## 2013-06-15 DIAGNOSIS — Z95818 Presence of other cardiac implants and grafts: Secondary | ICD-10-CM | POA: Insufficient documentation

## 2013-06-15 DIAGNOSIS — Y9210 Unspecified residential institution as the place of occurrence of the external cause: Secondary | ICD-10-CM | POA: Insufficient documentation

## 2013-06-15 DIAGNOSIS — F0280 Dementia in other diseases classified elsewhere without behavioral disturbance: Secondary | ICD-10-CM | POA: Insufficient documentation

## 2013-06-15 DIAGNOSIS — IMO0002 Reserved for concepts with insufficient information to code with codable children: Secondary | ICD-10-CM | POA: Insufficient documentation

## 2013-06-15 MED ORDER — ACETAMINOPHEN 325 MG PO TABS
650.0000 mg | ORAL_TABLET | Freq: Once | ORAL | Status: AC
  Administered 2013-06-15: 650 mg via ORAL
  Filled 2013-06-15: qty 2

## 2013-06-15 NOTE — ED Notes (Signed)
[facility] here to take pt back to [facility]. 

## 2013-06-15 NOTE — ED Provider Notes (Signed)
CSN: [id number]     Arrival date & time 06/15/13  1656 History   First MD Initiated Contact with Patient 06/15/13 1659     Chief Complaint  Patient presents with  . Back Pain   (Consider location/radiation/quality/duration/timing/severity/associated sxs/prior Treatment) Patient is a 79 y.o. female presenting with back pain. The history is provided by the patient and the nursing home (daughter).  Back Pain Location:  Thoracic spine Quality:  Burning Radiates to:  Does not radiate Pain severity:  Moderate Pain is:  Same all the time Onset quality:  Sudden Duration:  1 week Timing:  Intermittent Progression:  Waxing and waning Chronicity:  New Relieved by:  Nothing Worsened by:  Nothing tried Ineffective treatments:  None tried Associated symptoms: no abdominal pain, no chest pain, no dysuria, no fever and no headaches     Past Medical History  Diagnosis Date  . IBS (irritable bowel syndrome)   . HTN (hypertension)   . Osteoporosis   . Hyperlipidemia   . Hyperkalemia   . Diabetes mellitus   . Alzheimer disease   . Arthritis    Past Surgical History  Procedure Laterality Date  . Abdominal hysterectomy    . Cardiac catheterization    . Hip arthroplasty  07/22/2011    Procedure: ARTHROPLASTY BIPOLAR HIP;  Surgeon: Darreld Mclean, MD;  Location: [facility];  Service: Orthopedics;  Laterality: Left;   No family history on file. History  Substance Use Topics  . Smoking status: Never Smoker   . Smokeless tobacco: Not on file  . Alcohol Use: No   OB History   Grav Para Term Preterm Abortions TAB SAB Ect Mult Living                 Review of Systems  Constitutional: Negative for fever and fatigue.  HENT: Negative for congestion and drooling.   Eyes: Negative for pain.  Respiratory: Negative for cough and shortness of breath.   Cardiovascular: Negative for chest pain.  Gastrointestinal: Negative for nausea, vomiting, abdominal pain and diarrhea.  Genitourinary: Negative for  dysuria and hematuria.  Musculoskeletal: Positive for back pain. Negative for gait problem and neck pain.  Skin: Negative for color change.  Neurological: Negative for dizziness and headaches.  Hematological: Negative for adenopathy.  Psychiatric/Behavioral: Negative for behavioral problems.  All other systems reviewed and are negative.    Allergies  Penicillins and Quinine derivatives  Home Medications   Current Outpatient Rx  Name  Route  Sig  Dispense  Refill  . acetaminophen (TYLENOL) 325 MG tablet   Oral   Take 650 mg by mouth 3 (three) times daily.         Marland Kitchen acetaminophen (TYLENOL) 500 MG tablet   Oral   Take 500 mg by mouth every 4 (four) hours as needed for mild pain, fever or headache (for fever over 101 degress, headache or minor discomfort).         Marland Kitchen albuterol (ACCUNEB) 0.63 MG/3ML nebulizer solution   Nebulization   Take 1 ampule by nebulization 2 (two) times daily.          Marland Kitchen ALPRAZolam (XANAX) 0.25 MG tablet   Oral   Take 0.25 mg by mouth every 8 (eight) hours as needed for anxiety.         Marland Kitchen alum & mag hydroxide-simeth (MAALOX/MYLANTA) 200-200-20 MG/5ML suspension   Oral   Take 30 mLs by mouth every 6 (six) hours as needed for indigestion.         Marland Kitchen  Artificial Tear Solution (SYSTANE CONTACTS) SOLN   Right Eye   Place 1 drop into the right eye 3 (three) times daily.          . bimatoprost (LUMIGAN) 0.01 % SOLN   Both Eyes   Place 1 drop into both eyes at bedtime.          . cholecalciferol (VITAMIN D) 1000 UNITS tablet   Oral   Take 1,000 Units by mouth every morning.          Marland Kitchen Dextromethorphan-Guaifenesin (SILTUSSIN DM ALCOHOL FREE) 10-100 MG/5ML liquid   Oral   Take 10 mLs by mouth every 4 (four) hours as needed (Cough).         . feeding supplement (PRO-STAT SUGAR FREE 64) LIQD   Oral   Take 30 mLs by mouth 3 (three) times daily with meals.         Marland Kitchen guaifenesin (ROBITUSSIN) 100 MG/5ML syrup   Oral   Take 200 mg by  mouth every 6 (six) hours as needed for cough.          Marland Kitchen ipratropium (ATROVENT) 0.02 % nebulizer solution   Nebulization   Take 500 mcg by nebulization every 6 (six) hours.         Marland Kitchen loperamide (IMODIUM) 2 MG capsule   Oral   Take 2 mg by mouth 4 (four) times daily as needed for diarrhea or loose stools. Not to exceed 8 doses in 24hr         . LORazepam (ATIVAN) 0.5 MG tablet   Oral   Take 0.25 mg by mouth 4 (four) times daily.         . magnesium hydroxide (MILK OF MAGNESIA) 400 MG/5ML suspension   Oral   Take 30 mLs by mouth at bedtime as needed for constipation.         . metFORMIN (GLUCOPHAGE) 500 MG tablet   Oral   Take 250 mg by mouth every evening.          . mirtazapine (REMERON) 15 MG tablet   Oral   Take 7.5-15 mg by mouth at bedtime. Take 7.5 mg for seven days then continue on 15mg  daily         . omeprazole (PRILOSEC) 40 MG capsule   Oral   Take 40 mg by mouth every morning.          . Probiotic Product (RISA-BID PROBIOTIC PO)   Oral   Take 1 tablet by mouth every morning.          . promethazine (PHENERGAN) 12.5 MG tablet   Oral   Take 12.5 mg by mouth every 6 (six) hours as needed for nausea.         . rivastigmine (EXELON) 9.5 mg/24hr   Transdermal   Place 1 patch onto the skin daily. Remove used patches before applying a new patch         . traMADol (ULTRAM) 50 MG tablet   Oral   Take 50 mg by mouth every 6 (six) hours as needed for moderate pain.          . traMADol (ULTRAM) 50 MG tablet   Oral   Take 50 mg by mouth 2 (two) times daily.           BP 126/66  Pulse 105  Resp 16  SpO2 100% Physical Exam  Nursing note and vitals reviewed. Constitutional: She is oriented to person, place, and time. She appears well-developed and  well-nourished.  HENT:  Head: Normocephalic.  Mouth/Throat: Oropharynx is clear and moist. No oropharyngeal exudate.  Eyes: Conjunctivae and EOM are normal. Pupils are equal, round, and  reactive to light.  Neck: Normal range of motion. Neck supple.  No focal vertebral tenderness to palpation.  Cardiovascular: Normal rate, regular rhythm, normal heart sounds and intact distal pulses.  Exam reveals no gallop and no friction rub.   No murmur heard. Pulmonary/Chest: Effort normal and breath sounds normal. No respiratory distress. She has no wheezes.  Abdominal: Soft. Bowel sounds are normal. There is no tenderness. There is no rebound and no guarding.  Musculoskeletal: Normal range of motion. She exhibits no edema and no tenderness.  Mild abrasion to the left paraspinal thoracic area. Mild tenderness to palpation in this area.  Neurological: She is alert and oriented to person, place, and time.  Skin: Skin is warm and dry.  Multiple excoriated lesions to the face.   Psychiatric: She has a normal mood and affect. Her behavior is normal.    ED Course  Procedures (including critical care time) Labs Review Labs Reviewed - No data to display Imaging Review Dg Chest 2 View  06/15/2013   CLINICAL DATA:  Altered mental status.  Hypertension and diabetes.  EXAM: CHEST  2 VIEW  COMPARISON:  04/20/2013  FINDINGS: Heart size is within normal limits and stable. Both lungs are clear. No evidence of pleural effusion. No evidence of mass or lymphadenopathy.  IMPRESSION: No active cardiopulmonary disease.   Electronically Signed   By: Myles Rosenthal M.D.   On: 06/15/2013 19:30    EKG Interpretation   None       MDM   1. Abrasion    6:16 PM 79 y.o. female who presents from the [place] with back pain. I spoke with one of the patient's nurses named Wilfred Lacy. She states that the patient had a fall approximately one week ago. The patient suffered a scratch to her left paraspinal thoracic area. The nurse at the facility stated that the patient appeared to be having pain in that area and sent her to the ER for evaluation. The daughter is here on exam and states that the patient's mental  status is at baseline and that she has had a scratch for approximately one week. The patient has no specific complaints on exam and is alert and oriented x2. She is afebrile and vital signs are unremarkable here. Will get screening chest x-ray.   7:49 PM: I interpreted/reviewed the labs and/or imaging which were non-contributory.   I have discussed the diagnosis/risks/treatment options with the patient and family and believe the pt to be eligible for discharge home to follow-up with pcp. We also discussed returning to the ED immediately if new or worsening sx occur. We discussed the sx which are most concerning (e.g., worsening pain, fever) that necessitate immediate return. Any new prescriptions provided to the patient are listed below.    Junius Argyle, MD 06/16/13 [phone]

## 2013-06-15 NOTE — ED Notes (Signed)
Patient back from  X-ray 

## 2013-06-15 NOTE — ED Notes (Signed)
Bed: WA24 Expected date:  Expected time:  Means of arrival:  Comments: EMS- back pain 

## 2013-06-15 NOTE — ED Notes (Signed)
Pt BIB EMS. Pt has hx of frequent unwitnessed falls. Pt has a bruise to her ribs on the L side near her back. Pt has hx of anxiety and tends to yell out per EMS. Pt is from [facility].

## 2013-08-18 ENCOUNTER — Encounter (HOSPITAL_COMMUNITY)
Admission: EM | Disposition: A | Discharge status: Skilled Nursing Facility | Payer: Self-pay | Source: Home / Self Care | Attending: Internal Medicine

## 2013-08-18 ENCOUNTER — Inpatient Hospital Stay (HOSPITAL_COMMUNITY)
Admission: EM | Admit: 2013-08-18 | Discharge: 2013-08-22 | DRG: 481 | Disposition: A | Discharge status: Skilled Nursing Facility | Payer: Medicare Other | Attending: Internal Medicine | Admitting: Internal Medicine

## 2013-08-18 ENCOUNTER — Inpatient Hospital Stay (HOSPITAL_COMMUNITY): Payer: Medicare Other

## 2013-08-18 ENCOUNTER — Encounter (HOSPITAL_COMMUNITY): Payer: Self-pay | Admitting: Emergency Medicine

## 2013-08-18 ENCOUNTER — Inpatient Hospital Stay (HOSPITAL_COMMUNITY): Payer: Medicare Other | Admitting: Anesthesiology

## 2013-08-18 ENCOUNTER — Emergency Department (HOSPITAL_COMMUNITY): Payer: Medicare Other

## 2013-08-18 ENCOUNTER — Encounter (HOSPITAL_COMMUNITY): Payer: Medicare Other | Admitting: Anesthesiology

## 2013-08-18 DIAGNOSIS — K589 Irritable bowel syndrome without diarrhea: Secondary | ICD-10-CM | POA: Diagnosis present

## 2013-08-18 DIAGNOSIS — F0392 Unspecified dementia, unspecified severity, with psychotic disturbance: Secondary | ICD-10-CM

## 2013-08-18 DIAGNOSIS — E785 Hyperlipidemia, unspecified: Secondary | ICD-10-CM | POA: Diagnosis present

## 2013-08-18 DIAGNOSIS — E876 Hypokalemia: Secondary | ICD-10-CM | POA: Diagnosis present

## 2013-08-18 DIAGNOSIS — R5381 Other malaise: Secondary | ICD-10-CM

## 2013-08-18 DIAGNOSIS — M129 Arthropathy, unspecified: Secondary | ICD-10-CM | POA: Diagnosis present

## 2013-08-18 DIAGNOSIS — F0390 Unspecified dementia without behavioral disturbance: Secondary | ICD-10-CM

## 2013-08-18 DIAGNOSIS — F419 Anxiety disorder, unspecified: Secondary | ICD-10-CM

## 2013-08-18 DIAGNOSIS — R1110 Vomiting, unspecified: Secondary | ICD-10-CM

## 2013-08-18 DIAGNOSIS — W19XXXA Unspecified fall, initial encounter: Secondary | ICD-10-CM | POA: Diagnosis present

## 2013-08-18 DIAGNOSIS — S72141A Displaced intertrochanteric fracture of right femur, initial encounter for closed fracture: Secondary | ICD-10-CM

## 2013-08-18 DIAGNOSIS — F411 Generalized anxiety disorder: Secondary | ICD-10-CM

## 2013-08-18 DIAGNOSIS — S72143A Displaced intertrochanteric fracture of unspecified femur, initial encounter for closed fracture: Principal | ICD-10-CM | POA: Diagnosis present

## 2013-08-18 DIAGNOSIS — S72009A Fracture of unspecified part of neck of unspecified femur, initial encounter for closed fracture: Secondary | ICD-10-CM | POA: Diagnosis present

## 2013-08-18 DIAGNOSIS — S72002A Fracture of unspecified part of neck of left femur, initial encounter for closed fracture: Secondary | ICD-10-CM

## 2013-08-18 DIAGNOSIS — I10 Essential (primary) hypertension: Secondary | ICD-10-CM | POA: Diagnosis present

## 2013-08-18 DIAGNOSIS — S72001A Fracture of unspecified part of neck of right femur, initial encounter for closed fracture: Secondary | ICD-10-CM | POA: Diagnosis present

## 2013-08-18 DIAGNOSIS — Z79899 Other long term (current) drug therapy: Secondary | ICD-10-CM

## 2013-08-18 DIAGNOSIS — Z96649 Presence of unspecified artificial hip joint: Secondary | ICD-10-CM

## 2013-08-18 DIAGNOSIS — D649 Anemia, unspecified: Secondary | ICD-10-CM | POA: Diagnosis present

## 2013-08-18 DIAGNOSIS — S42309A Unspecified fracture of shaft of humerus, unspecified arm, initial encounter for closed fracture: Secondary | ICD-10-CM

## 2013-08-18 DIAGNOSIS — Z888 Allergy status to other drugs, medicaments and biological substances status: Secondary | ICD-10-CM

## 2013-08-18 DIAGNOSIS — G9340 Encephalopathy, unspecified: Secondary | ICD-10-CM

## 2013-08-18 DIAGNOSIS — E119 Type 2 diabetes mellitus without complications: Secondary | ICD-10-CM | POA: Diagnosis present

## 2013-08-18 DIAGNOSIS — Z880 Allergy status to penicillin: Secondary | ICD-10-CM

## 2013-08-18 DIAGNOSIS — G309 Alzheimer's disease, unspecified: Secondary | ICD-10-CM | POA: Diagnosis present

## 2013-08-18 DIAGNOSIS — M810 Age-related osteoporosis without current pathological fracture: Secondary | ICD-10-CM | POA: Diagnosis present

## 2013-08-18 DIAGNOSIS — Y9210 Unspecified residential institution as the place of occurrence of the external cause: Secondary | ICD-10-CM | POA: Diagnosis present

## 2013-08-18 DIAGNOSIS — T148XXA Other injury of unspecified body region, initial encounter: Secondary | ICD-10-CM

## 2013-08-18 DIAGNOSIS — F05 Delirium due to known physiological condition: Secondary | ICD-10-CM

## 2013-08-18 DIAGNOSIS — F0280 Dementia in other diseases classified elsewhere without behavioral disturbance: Secondary | ICD-10-CM | POA: Diagnosis present

## 2013-08-18 DIAGNOSIS — N390 Urinary tract infection, site not specified: Secondary | ICD-10-CM | POA: Diagnosis present

## 2013-08-18 HISTORY — PX: INTRAMEDULLARY (IM) NAIL INTERTROCHANTERIC: SHX5875

## 2013-08-18 LAB — CBC WITH DIFFERENTIAL/PLATELET
BASOS PCT: 1 % (ref 0–1)
Basophils Absolute: 0 10*3/uL (ref 0.0–0.1)
Eosinophils Absolute: 0 10*3/uL (ref 0.0–0.7)
Eosinophils Relative: 1 % (ref 0–5)
HCT: 31.6 % — ABNORMAL LOW (ref 36.0–46.0)
HEMOGLOBIN: 10.9 g/dL — AB (ref 12.0–15.0)
LYMPHS ABS: 1.1 10*3/uL (ref 0.7–4.0)
LYMPHS PCT: 22 % (ref 12–46)
MCH: 33.7 pg (ref 26.0–34.0)
MCHC: 34.5 g/dL (ref 30.0–36.0)
MCV: 97.8 fL (ref 78.0–100.0)
MONOS PCT: 12 % (ref 3–12)
Monocytes Absolute: 0.6 10*3/uL (ref 0.1–1.0)
NEUTROS PCT: 65 % (ref 43–77)
Neutro Abs: 3.1 10*3/uL (ref 1.7–7.7)
Platelets: 245 10*3/uL (ref 150–400)
RBC: 3.23 MIL/uL — AB (ref 3.87–5.11)
RDW: 13 % (ref 11.5–15.5)
WBC: 4.8 10*3/uL (ref 4.0–10.5)

## 2013-08-18 LAB — COMPREHENSIVE METABOLIC PANEL
ALBUMIN: 2.8 g/dL — AB (ref 3.5–5.2)
ALK PHOS: 142 U/L — AB (ref 39–117)
ALT: 22 U/L (ref 0–35)
AST: 37 U/L (ref 0–37)
BILIRUBIN TOTAL: 0.3 mg/dL (ref 0.3–1.2)
BUN: 20 mg/dL (ref 6–23)
CO2: 25 mEq/L (ref 19–32)
Calcium: 8.4 mg/dL (ref 8.4–10.5)
Chloride: 102 mEq/L (ref 96–112)
Creatinine, Ser: 0.6 mg/dL (ref 0.50–1.10)
GFR calc Af Amer: >90 mL/min (ref >90)
GFR calc non Af Amer: 84 mL/min — ABNORMAL LOW (ref >90)
GLUCOSE: 180 mg/dL — AB (ref 70–99)
POTASSIUM: 3.2 meq/L — AB (ref 3.7–5.3)
Sodium: 137 mEq/L (ref 137–147)
Total Protein: 5.8 g/dL — ABNORMAL LOW (ref 6.0–8.3)

## 2013-08-18 LAB — URINE MICROSCOPIC-ADD ON

## 2013-08-18 LAB — URINALYSIS, ROUTINE W REFLEX MICROSCOPIC
Glucose, UA: >1000 mg/dL — AB
HGB URINE DIPSTICK: NEGATIVE
Ketones, ur: 15 mg/dL — AB
Leukocytes, UA: NEGATIVE
Nitrite: NEGATIVE
PH: 6.5 (ref 5.0–8.0)
Protein, ur: NEGATIVE mg/dL
SPECIFIC GRAVITY, URINE: 1.027 (ref 1.005–1.030)
Urobilinogen, UA: 1 mg/dL (ref 0.0–1.0)

## 2013-08-18 LAB — PROTIME-INR
INR: 1.05 (ref 0.00–1.49)
PROTHROMBIN TIME: 13.5 s (ref 11.6–15.2)

## 2013-08-18 LAB — GLUCOSE, CAPILLARY
GLUCOSE-CAPILLARY: 131 mg/dL — AB (ref 70–99)
GLUCOSE-CAPILLARY: 155 mg/dL — AB (ref 70–99)

## 2013-08-18 LAB — TROPONIN I
Troponin I: <0.3 ng/mL (ref <0.30)
Troponin I: <0.3 ng/mL (ref <0.30)

## 2013-08-18 SURGERY — FIXATION, FRACTURE, INTERTROCHANTERIC, WITH INTRAMEDULLARY ROD
Anesthesia: General | Site: Hip | Laterality: Right

## 2013-08-18 MED ORDER — LORAZEPAM 0.5 MG PO TABS
0.2500 mg | ORAL_TABLET | Freq: Four times a day (QID) | ORAL | Status: DC
  Administered 2013-08-19 – 2013-08-22 (×13): 0.25 mg via ORAL
  Filled 2013-08-18 (×13): qty 1

## 2013-08-18 MED ORDER — IPRATROPIUM BROMIDE 0.02 % IN SOLN: 0.5000 mg | Freq: Four times a day (QID) | RESPIRATORY_TRACT | Status: DC

## 2013-08-18 MED ORDER — INSULIN ASPART 100 UNIT/ML ~~LOC~~ SOLN: 0.0000 [IU] | Freq: Every day | SUBCUTANEOUS | Status: DC

## 2013-08-18 MED ORDER — VITAMIN D3 25 MCG (1000 UNIT) PO TABS
1000.0000 [IU] | ORAL_TABLET | Freq: Every morning | ORAL | Status: DC
  Administered 2013-08-19 – 2013-08-22 (×4): 1000 [IU] via ORAL
  Filled 2013-08-18 (×4): qty 1

## 2013-08-18 MED ORDER — MORPHINE SULFATE 2 MG/ML IJ SOLN
0.5000 mg | INTRAMUSCULAR | Status: DC | PRN
  Administered 2013-08-18: 1 mg via INTRAVENOUS

## 2013-08-18 MED ORDER — CLINDAMYCIN PHOSPHATE 900 MG/50ML IV SOLN
INTRAVENOUS | Status: AC
  Filled 2013-08-18: qty 50

## 2013-08-18 MED ORDER — ALBUTEROL SULFATE (2.5 MG/3ML) 0.083% IN NEBU: 2.5000 mg | INHALATION_SOLUTION | Freq: Two times a day (BID) | RESPIRATORY_TRACT | Status: DC

## 2013-08-18 MED ORDER — ONDANSETRON HCL 4 MG PO TABS: 4.0000 mg | ORAL_TABLET | Freq: Four times a day (QID) | ORAL | Status: DC | PRN

## 2013-08-18 MED ORDER — ENOXAPARIN SODIUM 40 MG/0.4ML ~~LOC~~ SOLN
40.0000 mg | SUBCUTANEOUS | Status: DC
  Filled 2013-08-18 (×2): qty 0.4

## 2013-08-18 MED ORDER — MORPHINE SULFATE 2 MG/ML IJ SOLN
INTRAMUSCULAR | Status: AC
  Filled 2013-08-18: qty 1

## 2013-08-18 MED ORDER — SODIUM CHLORIDE 0.9 % IV BOLUS (SEPSIS)
500.0000 mL | Freq: Once | INTRAVENOUS | Status: AC
  Administered 2013-08-18: 500 mL via INTRAVENOUS

## 2013-08-18 MED ORDER — ONDANSETRON HCL 4 MG/2ML IJ SOLN
INTRAMUSCULAR | Status: AC
  Filled 2013-08-18: qty 2

## 2013-08-18 MED ORDER — LATANOPROST 0.005 % OP SOLN
1.0000 [drp] | Freq: Every day | OPHTHALMIC | Status: DC
Start: 2013-08-18 — End: 2013-08-22
  Administered 2013-08-19 – 2013-08-21 (×3): 1 [drp] via OPHTHALMIC
  Filled 2013-08-18: qty 2.5

## 2013-08-18 MED ORDER — ACETAMINOPHEN 325 MG PO TABS
650.0000 mg | ORAL_TABLET | Freq: Three times a day (TID) | ORAL | Status: DC
  Administered 2013-08-19 – 2013-08-22 (×9): 650 mg via ORAL
  Filled 2013-08-18 (×14): qty 2

## 2013-08-18 MED ORDER — FENTANYL CITRATE 0.05 MG/ML IJ SOLN
INTRAMUSCULAR | Status: AC
  Filled 2013-08-18: qty 2

## 2013-08-18 MED ORDER — BISACODYL 10 MG RE SUPP: 10.0000 mg | Freq: Every day | RECTAL | Status: DC | PRN

## 2013-08-18 MED ORDER — LIDOCAINE HCL (CARDIAC) 20 MG/ML IV SOLN
INTRAVENOUS | Status: AC
  Filled 2013-08-18: qty 5

## 2013-08-18 MED ORDER — LACTATED RINGERS IV SOLN
INTRAVENOUS | Status: DC | PRN
  Administered 2013-08-18: 23:00:00 via INTRAVENOUS

## 2013-08-18 MED ORDER — HYDROCODONE-ACETAMINOPHEN 5-325 MG PO TABS: 1.0000 | ORAL_TABLET | ORAL | Status: DC | PRN

## 2013-08-18 MED ORDER — ACETAMINOPHEN 10 MG/ML IV SOLN
1000.0000 mg | Freq: Once | INTRAVENOUS | Status: AC
  Administered 2013-08-18: 1000 mg via INTRAVENOUS
  Filled 2013-08-18: qty 100

## 2013-08-18 MED ORDER — SODIUM CHLORIDE 0.9 % IV SOLN
INTRAVENOUS | Status: DC
  Administered 2013-08-19: 02:00:00 via INTRAVENOUS

## 2013-08-18 MED ORDER — IPRATROPIUM-ALBUTEROL 0.5-2.5 (3) MG/3ML IN SOLN
3.0000 mL | Freq: Two times a day (BID) | RESPIRATORY_TRACT | Status: DC
  Administered 2013-08-18 – 2013-08-21 (×5): 3 mL via RESPIRATORY_TRACT
  Filled 2013-08-18 (×7): qty 3

## 2013-08-18 MED ORDER — CLOMIPRAMINE HCL 25 MG PO CAPS
50.0000 mg | ORAL_CAPSULE | Freq: Two times a day (BID) | ORAL | Status: DC
  Administered 2013-08-19 (×2): 50 mg via ORAL
  Filled 2013-08-18 (×5): qty 2

## 2013-08-18 MED ORDER — PANTOPRAZOLE SODIUM 40 MG PO TBEC
40.0000 mg | DELAYED_RELEASE_TABLET | Freq: Every day | ORAL | Status: DC
  Administered 2013-08-19: 40 mg via ORAL
  Filled 2013-08-18: qty 1

## 2013-08-18 MED ORDER — ISOPROPYL ALCOHOL 70 % SOLN
Status: AC
  Filled 2013-08-18: qty 480

## 2013-08-18 MED ORDER — ONDANSETRON HCL 4 MG/2ML IJ SOLN: 4.0000 mg | Freq: Three times a day (TID) | INTRAMUSCULAR | Status: DC | PRN

## 2013-08-18 MED ORDER — SULFAMETHOXAZOLE-TMP DS 800-160 MG PO TABS
1.0000 | ORAL_TABLET | Freq: Two times a day (BID) | ORAL | Status: DC
  Administered 2013-08-19 (×2): 1 via ORAL
  Filled 2013-08-18 (×5): qty 1

## 2013-08-18 MED ORDER — MORPHINE SULFATE 2 MG/ML IJ SOLN: 0.5000 mg | INTRAMUSCULAR | Status: DC | PRN

## 2013-08-18 MED ORDER — MIRTAZAPINE 15 MG PO TABS
15.0000 mg | ORAL_TABLET | Freq: Every day | ORAL | Status: DC
  Administered 2013-08-19 – 2013-08-21 (×3): 15 mg via ORAL
  Filled 2013-08-18 (×5): qty 1

## 2013-08-18 MED ORDER — ONDANSETRON HCL 4 MG/2ML IJ SOLN: 4.0000 mg | Freq: Four times a day (QID) | INTRAMUSCULAR | Status: DC | PRN

## 2013-08-18 MED ORDER — KETAMINE HCL 10 MG/ML IJ SOLN
INTRAMUSCULAR | Status: AC
  Filled 2013-08-18: qty 1

## 2013-08-18 MED ORDER — INSULIN ASPART 100 UNIT/ML ~~LOC~~ SOLN
0.0000 [IU] | Freq: Three times a day (TID) | SUBCUTANEOUS | Status: DC
  Administered 2013-08-19: 2 [IU] via SUBCUTANEOUS
  Administered 2013-08-19: 1 [IU] via SUBCUTANEOUS
  Administered 2013-08-20 (×2): 2 [IU] via SUBCUTANEOUS
  Administered 2013-08-21: 1 [IU] via SUBCUTANEOUS
  Administered 2013-08-21: 5 [IU] via SUBCUTANEOUS
  Administered 2013-08-22: 2 [IU] via SUBCUTANEOUS
  Administered 2013-08-22: 3 [IU] via SUBCUTANEOUS

## 2013-08-18 MED ORDER — KETAMINE HCL 10 MG/ML IJ SOLN
INTRAMUSCULAR | Status: DC | PRN
  Administered 2013-08-18 (×2): 20 mg via INTRAVENOUS
  Administered 2013-08-18: 30 mg via INTRAVENOUS
  Administered 2013-08-18: 20 mg via INTRAVENOUS

## 2013-08-18 MED ORDER — RIVASTIGMINE 9.5 MG/24HR TD PT24
9.5000 mg | MEDICATED_PATCH | Freq: Every day | TRANSDERMAL | Status: DC
  Administered 2013-08-19 – 2013-08-22 (×4): 9.5 mg via TRANSDERMAL
  Filled 2013-08-18 (×6): qty 1

## 2013-08-18 MED ORDER — POTASSIUM CHLORIDE CRYS ER 20 MEQ PO TBCR
40.0000 meq | EXTENDED_RELEASE_TABLET | ORAL | Status: AC
  Filled 2013-08-18 (×2): qty 2

## 2013-08-18 MED ORDER — TRAMADOL HCL 50 MG PO TABS: 50.0000 mg | ORAL_TABLET | Freq: Four times a day (QID) | ORAL | Status: DC | PRN

## 2013-08-18 MED ORDER — PROPOFOL 10 MG/ML IV BOLUS
INTRAVENOUS | Status: AC
  Filled 2013-08-18: qty 20

## 2013-08-18 MED ORDER — FENTANYL CITRATE 0.05 MG/ML IJ SOLN
INTRAMUSCULAR | Status: DC | PRN
  Administered 2013-08-18 (×4): 25 ug via INTRAVENOUS

## 2013-08-18 SURGICAL SUPPLY — 44 items
BLADE SURG 15 STRL LF DISP TIS (BLADE) ×1 IMPLANT
BLADE SURG 15 STRL SS (BLADE) ×3
BLADE SURG ROTATE 9660 (MISCELLANEOUS) IMPLANT
COVER SURGICAL LIGHT HANDLE (MISCELLANEOUS) ×5 IMPLANT
DRAPE ORTHO SPLIT 77X108 STRL (DRAPES)
DRAPE PROXIMA HALF (DRAPES) IMPLANT
DRAPE STERI IOBAN 125X83 (DRAPES) ×3 IMPLANT
DRAPE SURG ORHT 6 SPLT 77X108 (DRAPES) IMPLANT
DRSG MEPILEX BORDER 4X4 (GAUZE/BANDAGES/DRESSINGS) ×4 IMPLANT
DRSG MEPILEX BORDER 4X8 (GAUZE/BANDAGES/DRESSINGS) ×1 IMPLANT
DURAPREP 26ML APPLICATOR (WOUND CARE) ×3 IMPLANT
ELECT REM PT RETURN 9FT ADLT (ELECTROSURGICAL) ×3
ELECTRODE REM PT RTRN 9FT ADLT (ELECTROSURGICAL) ×1 IMPLANT
FACESHIELD LNG OPTICON STERILE (SAFETY) ×3 IMPLANT
GAUZE XEROFORM 5X9 LF (GAUZE/BANDAGES/DRESSINGS) ×3 IMPLANT
GLOVE BIOGEL PI IND STRL 8 (GLOVE) ×1 IMPLANT
GLOVE BIOGEL PI INDICATOR 8 (GLOVE) ×2
GLOVE SURG ORTHO 8.0 STRL STRW (GLOVE) ×3 IMPLANT
GOWN STRL REUS W/ TWL XL LVL3 (GOWN DISPOSABLE) IMPLANT
GOWN STRL REUS W/TWL LRG LVL3 (GOWN DISPOSABLE) ×3 IMPLANT
GOWN STRL REUS W/TWL XL LVL3 (GOWN DISPOSABLE)
GUIDE PIN 3.2 LONG (PIN) ×2 IMPLANT
GUIDE PIN 3.2MM (MISCELLANEOUS) ×3
GUIDE PIN ORTH 343X3.2XBRAD (MISCELLANEOUS) IMPLANT
HIP SCREW SET (Screw) ×2 IMPLANT
KIT BASIN OR (CUSTOM PROCEDURE TRAY) ×3 IMPLANT
KIT ROOM TURNOVER OR (KITS) ×3 IMPLANT
MANIFOLD NEPTUNE II (INSTRUMENTS) ×3 IMPLANT
NAIL IMSH 10X36 (Nail) ×2 IMPLANT
NS IRRIG 1000ML POUR BTL (IV SOLUTION) ×1 IMPLANT
PACK GENERAL/GYN (CUSTOM PROCEDURE TRAY) ×3 IMPLANT
PAD ARMBOARD 7.5X6 YLW CONV (MISCELLANEOUS) ×6 IMPLANT
SCREW COMPRESSION (Screw) ×2 IMPLANT
SCREW LAG 80 (Screw) IMPLANT
SCREW LAG 80MM (Screw) ×3 IMPLANT
SPONGE LAP 4X18 X RAY DECT (DISPOSABLE) IMPLANT
STAPLER VISISTAT 35W (STAPLE) ×1 IMPLANT
SUT ETHILON 2 0 FS 18 (SUTURE) IMPLANT
SUT VIC AB 0 CT1 36 (SUTURE) ×2 IMPLANT
SUT VIC AB 2-0 CTB1 (SUTURE) ×3 IMPLANT
TAPE STRIPS DRAPE STRL (GAUZE/BANDAGES/DRESSINGS) IMPLANT
TOWEL OR 17X24 6PK STRL BLUE (TOWEL DISPOSABLE) ×3 IMPLANT
TOWEL OR 17X26 10 PK STRL BLUE (TOWEL DISPOSABLE) ×3 IMPLANT
WATER STERILE IRR 1000ML POUR (IV SOLUTION) ×1 IMPLANT

## 2013-08-18 NOTE — Progress Notes (Signed)
RN Unable to place foley catheter due to patient confusion; resistance, and agitation. Please place in OR.

## 2013-08-18 NOTE — Anesthesia Preprocedure Evaluation (Addendum)
Anesthesia Evaluation  Patient identified by MRN, date of birth, ID band  Reviewed: Allergy & Precautions, H&P , NPO status , Patient's Chart, lab work & pertinent test results  Airway Mallampati: II      Dental  (+) Edentulous Upper and Edentulous Lower   Pulmonary  breath sounds clear to auscultation        Cardiovascular hypertension, Pt. on medications Rhythm:Regular Rate:Normal     Neuro/Psych PSYCHIATRIC DISORDERS (Alzheimer's dementia)    GI/Hepatic   Endo/Other  diabetes, Well Controlled, Type 2, Insulin Dependent  Renal/GU      Musculoskeletal   Abdominal   Peds  Hematology   Anesthesia Other Findings   Reproductive/Obstetrics                       Anesthesia Physical  Anesthesia Plan  ASA: III  Anesthesia Plan: General   Post-op Pain Management:    Induction: Intravenous  Airway Management Planned: Nasal Cannula  Additional Equipment:   Intra-op Plan:   Post-operative Plan:   Informed Consent: I have reviewed the patients History and Physical, chart, labs and discussed the procedure including the risks, benefits and alternatives for the proposed anesthesia with the patient or authorized representative who has indicated his/her understanding and acceptance.     Plan Discussed with:   Anesthesia Plan Comments:        Anesthesia Quick Evaluation

## 2013-08-18 NOTE — Progress Notes (Signed)
INITIAL NUTRITION ASSESSMENT  DOCUMENTATION CODES Per approved criteria  -Not Applicable   INTERVENTION: - Diet advancement per MD - Recommend Ensure Complete BID once diet advanced and for nursing to assist pt at mealtimes - Will continue to monitor   NUTRITION DIAGNOSIS: Inadequate oral intake related to inability to eat as evidenced by NPO.   Goal: Advance diet as tolerated to regular diet  Monitor:  Weights, labs, diet advancement  Reason for Assessment: Malnutrition screening tool   81 y.o. female  Admitting Dx: Fall  ASSESSMENT: Pt from [facility]. Pt with dementia, admitted with fall. Workup reveals an intertrochanteric right hip fracture. Per conversation with staff at facility, pt was on a finger food diet and getting Ensure BID, not on any thickened liquids. Staff reports pt's daughter would come daily and help her eat and that pt required lots of verbal coaching at mealtimes. Reports her weight has been stable. Staff reports pt typically would eat 40-50% of meals most days and 75% of meals on a good day.    Potassium low Alk phos elevated   Height: Ht Readings from Last 1 Encounters:  08/18/13 5' 4" (1.626 m)    Weight: Wt Readings from Last 1 Encounters:  08/18/13 123 lb 4.8 oz (55.929 kg)    Ideal Body Weight: 120 lb   % Ideal Body Weight: 102%  Wt Readings from Last 10 Encounters:  08/18/13 123 lb 4.8 oz (55.929 kg)  09/04/11 135 lb (61.236 kg)  07/27/11 141 lb 6.4 oz (64.139 kg)  07/27/11 141 lb 6.4 oz (64.139 kg)  07/07/11 149 lb 0.5 oz (67.6 kg)  07/06/11 150 lb (68.04 kg)  07/05/11 140 lb (63.504 kg)  07/02/11 150 lb (68.04 kg)  06/30/11 150 lb (68.04 kg)    Usual Body Weight: 123 lb  % Usual Body Weight: 100%  BMI:  Body mass index is 21.15 kg/(m^2).  Estimated Nutritional Needs: Kcal: 1525-1725 Protein: 75-85g Fluid: 1.5-1.7L/day   Skin: Intact   Diet Order: NPO  EDUCATION NEEDS: -No education needs  identified at this time  No intake or output data in the 24 hours ending 08/18/13 1616  Last BM: PTA  Labs:   Recent Labs Lab 08/18/13 1226  NA 137  K 3.2*  CL 102  CO2 25  BUN 20  CREATININE 0.60  CALCIUM 8.4  GLUCOSE 180*    CBG (last 3)  No results found for this basename: GLUCAP,  in the last 72 hours  Scheduled Meds:   Continuous Infusions:   Past Medical History  Diagnosis Date  . IBS (irritable bowel syndrome)   . HTN (hypertension)   . Osteoporosis   . Hyperlipidemia   . Hyperkalemia   . Diabetes mellitus   . Alzheimer disease   . Arthritis     Past Surgical History  Procedure Laterality Date  . Abdominal hysterectomy    . Cardiac catheterization    . Hip arthroplasty  07/22/2011    Procedure: ARTHROPLASTY BIPOLAR HIP;  Surgeon: Wayne Keeling, MD;  Location: [facility];  Service: Orthopedics;  Laterality: Left;     Baron MS, RD, LDN [phone] Pager [phone] After Hours Pager  

## 2013-08-18 NOTE — H&P (Signed)
[facility] History and Physical  Michelle Sweeney ZOX:[id number] DOB: [birth year] DOA: 08/18/2013  Referring physician: EDP PCP: Donzetta Sprung, MD   Chief Complaint: Status post fall with right hip pain  HPI: Michelle Sweeney is a 80 y.o. female nursing home/memory care unit resident with history of Alzheimer's dementia, hypertension, diabetes mellitus, osteoporosis, recent Foley started on Bactrim for UTI who presents with above complaints. She was seen in the ED and right hip x-ray showed a mildly displaced and angulated intertrochanteric right femur fracture, chest x-ray showed no acute infiltrates, and EKG showed left axis deviation, normal sinus rhythm. Patient denies chest pain, shortness of breath, fevers. Orthopedics was consulted in the ED and admission to medicine service requested for further management.    Review of Systems The patient denies anorexia, fever, weight loss,, vision loss,  hoarseness, chest pain, syncope, dyspnea on exertion, peripheral edema, balance deficits, hemoptysis, abdominal pain, melena, hematochezia, severe indigestion/heartburn.   Past Medical History  Diagnosis Date  . IBS (irritable bowel syndrome)   . HTN (hypertension)   . Osteoporosis   . Hyperlipidemia   . Hyperkalemia   . Diabetes mellitus   . Alzheimer disease   . Arthritis    Past Surgical History  Procedure Laterality Date  . Abdominal hysterectomy    . Cardiac catheterization    . Hip arthroplasty  07/22/2011    Procedure: ARTHROPLASTY BIPOLAR HIP;  Surgeon: Darreld Mclean, MD;  Location: [facility];  Service: Orthopedics;  Laterality: Left;   Social History:  reports that she has never smoked. She does not have any smokeless tobacco history on file. She reports that she does not drink alcohol or use illicit drugs.  Allergies  Allergen Reactions  . Penicillins Other (See Comments)    Reaction unknown  . Quinine Derivatives Other (See Comments)    Reaction unknown    History  reviewed. No pertinent family history.   Prior to Admission medications   Medication Sig Start Date End Date Taking? Authorizing Provider  acetaminophen (TYLENOL) 325 MG tablet Take 650 mg by mouth 3 (three) times daily.   Yes Historical Provider, MD  albuterol (ACCUNEB) 0.63 MG/3ML nebulizer solution Take 1 ampule by nebulization 2 (two) times daily.   Yes Historical Provider, MD  ALPRAZolam (XANAX) 0.25 MG tablet Take 0.25 mg by mouth every 8 (eight) hours as needed for anxiety.   Yes Historical Provider, MD  bimatoprost (LUMIGAN) 0.01 % SOLN Place 1 drop into both eyes at bedtime.    Yes Historical Provider, MD  cholecalciferol (VITAMIN D) 1000 UNITS tablet Take 1,000 Units by mouth every morning.    Yes Historical Provider, MD  clomiPRAMINE (ANAFRANIL) 50 MG capsule Take 50 mg by mouth 2 (two) times daily.   Yes Historical Provider, MD  ENSURE PLUS (ENSURE PLUS) LIQD Take 237 mLs by mouth 4 (four) times daily -  with meals and at bedtime.   Yes Historical Provider, MD  feeding supplement (PRO-STAT SUGAR FREE 64) LIQD Take 30 mLs by mouth 3 (three) times daily with meals.   Yes Historical Provider, MD  ipratropium (ATROVENT) 0.02 % nebulizer solution Take 0.5 mg by nebulization every 6 (six) hours.    Yes Historical Provider, MD  LORazepam (ATIVAN) 0.5 MG tablet Take 0.25 mg by mouth 4 (four) times daily.   Yes Historical Provider, MD  metFORMIN (GLUCOPHAGE) 500 MG tablet Take 250 mg by mouth every evening.    Yes Historical Provider, MD  mirtazapine (REMERON) 15 MG tablet Take 15  mg by mouth at bedtime.   Yes Historical Provider, MD  omeprazole (PRILOSEC) 40 MG capsule Take 40 mg by mouth every morning.    Yes Historical Provider, MD  Polyethyl Glycol-Propyl Glycol (SYSTANE OP) Place 1 drop into the right eye 3 (three) times daily.   Yes Historical Provider, MD  Probiotic Product (RISA-BID PROBIOTIC PO) Take 1 tablet by mouth every morning.    Yes Historical Provider, MD  rivastigmine (EXELON)  9.5 mg/24hr Place 1 patch onto the skin daily. Remove used patches before applying a new patch   Yes Historical Provider, MD  sulfamethoxazole-trimethoprim (BACTRIM DS) 800-160 MG per tablet Take 1 tablet by mouth 2 (two) times daily.   Yes Historical Provider, MD  traMADol (ULTRAM) 50 MG tablet Take 50 mg by mouth See admin instructions. Take 1 tablet twice a day. May take 1 additional tablet every 6 hours as needed for pain   Yes Historical Provider, MD   Physical Exam: Filed Vitals:   08/18/13 1607  BP: 140/80  Pulse:   Temp: 98.4 F (36.9 C)  Resp: 16    BP 140/80  Pulse 79  Temp(Src) 98.4 F (36.9 C) (Oral)  Resp 16  Ht 5\' 4"  (1.626 m)  Wt 55.929 kg (123 lb 4.8 oz)  BMI 21.15 kg/m2  SpO2 99% Constitutional: Vital signs reviewed.  Patient is a well-developed and well-nourished in no acute distress and cooperative with exam. Alert and pleasantly confused, disoriented.  Head: Normocephalic and atraumatic Mouth: no erythema or exudates, MMM Eyes: PERRL, EOMI, conjunctivae normal, No scleral icterus.  Neck: Supple, Trachea midline normal ROM, No JVD, mass, thyromegaly, or carotid bruit present.  Cardiovascular: RRR, S1 normal, S2 normal, no MRG, pulses symmetric and intact bilaterally Pulmonary/Chest: normal respiratory effort, CTAB, no wheezes, rales, or rhonchi Abdominal: Soft. Non-tender, non-distended, bowel sounds are normal, no masses, organomegaly, or guarding present.  GU: no CVA tenderness  extremities: No cyanosis and no edema  Neurological: Alert, disoriented, cranial nerve II-XII are grossly intact, nonfocal  Skin: Warm, dry and intact. No rash, cyanosis, or clubbing.  Psychiatric: Normal mood and affect.               Labs on Admission:  Basic Metabolic Panel:  Recent Labs Lab 08/18/13 1226  NA 137  K 3.2*  CL 102  CO2 25  GLUCOSE 180*  BUN 20  CREATININE 0.60  CALCIUM 8.4   Liver Function Tests:  Recent Labs Lab 08/18/13 1226  AST 37  ALT  22  ALKPHOS 142*  BILITOT 0.3  PROT 5.8*  ALBUMIN 2.8*   No results found for this basename: LIPASE, AMYLASE,  in the last 168 hours No results found for this basename: AMMONIA,  in the last 168 hours CBC:  Recent Labs Lab 08/18/13 1226  WBC 4.8  NEUTROABS 3.1  HGB 10.9*  HCT 31.6*  MCV 97.8  PLT 245   Cardiac Enzymes: No results found for this basename: CKTOTAL, CKMB, CKMBINDEX, TROPONINI,  in the last 168 hours  BNP (last 3 results) No results found for this basename: PROBNP,  in the last 8760 hours CBG: No results found for this basename: GLUCAP,  in the last 168 hours  Radiological Exams on Admission: Dg Chest 1 View  08/18/2013   CLINICAL DATA:  Fall, a hip fracture  EXAM: CHEST - 1 VIEW  COMPARISON:  Prior chest x-ray 06/15/2013  FINDINGS: Stable cardiomegaly. Atherosclerotic and ectatic thoracic aorta. Likely ectasia of the ascending segment. Low inspiratory volumes  with mild bibasilar atelectasis. Chronic background bronchitic and interstitial changes. The bones appear osteopenic. No acute fracture or malalignment. Degenerative changes noted throughout the thoracic spine.  IMPRESSION: Low inspiratory volumes with mild bibasilar atelectasis.  Otherwise, stable chest x-ray.   Electronically Signed   By: Malachy MoanHeath  McCullough M.D.   On: 08/18/2013 12:59   Dg Hip Complete Right  08/18/2013   CLINICAL DATA:  Fall and hip pain.  EXAM: RIGHT HIP - COMPLETE 2+ VIEW  COMPARISON:  Left hip radiographs 05/24/2013  FINDINGS: There is a mildly displaced intertrochanteric fracture of the right femur with varus angulation. There is no dislocation. Prior left hip arthroplasty is again identified. Left femoral prosthesis remains approximated with the acetabulum. The bones are diffusely osteopenic. Degenerative changes are noted in the visualized lower lumbar spine.  IMPRESSION: Mildly displaced and angulated intertrochanteric right femur fracture.   Electronically Signed   By: Sebastian AcheAllen  Grady   On:  08/18/2013 12:59   Ct Head Wo Contrast  08/18/2013   CLINICAL DATA:  History of dementia, status post fall now with headache and neck pain  EXAM: CT HEAD WITHOUT CONTRAST  CT CERVICAL SPINE WITHOUT CONTRAST  TECHNIQUE: Multidetector CT imaging of the head and cervical spine was performed following the standard protocol without intravenous contrast. Multiplanar CT image reconstructions of the cervical spine were also generated.  COMPARISON:  CT HEAD W/O CM dated 07/20/2011  FINDINGS: CT HEAD FINDINGS  There is moderate diffuse cerebral and cerebellar atrophy with compensatory ventriculomegaly. There is no evidence of an acute intracranial hemorrhage nor of an evolving ischemic infarction. There is decreased density in the deep white matter of both cerebral hemispheres consistent with chronic small vessel ischemic type change. The cerebellum and brainstem are normal in density. There are no abnormal intracranial calcifications.  At bone window settings the observed portions of the paranasal sinuses and mastoid air cells are clear. There is no cephalohematoma nor evidence of an acute skull fracture.  CT CERVICAL SPINE FINDINGS  There is mild loss of the normal cervical lordosis. The cervical vertebral bodies are preserved in height. There is mild disc space narrowing at C5-6 and at C6-7. Small endplate osteophytes are present at these levels. The prevertebral soft tissue spaces appear normal. There is no evidence of a perched facet or spinous process fracture. There is mild facet joint degenerative change. The bony ring at each cervical level is intact. The odontoid is intact and the lateral masses of C1 align normally with those of C2.  IMPRESSION: 1. There is no evidence of an acute intracranial hemorrhage nor other acute intracranial abnormality. 2. There is moderate diffuse cerebral and cerebellar atrophy with compensatory ventriculomegaly. This has progressed slightly since the previous study. 3. There are  findings consistent with chronic small vessel ischemic type change. 4. There is mild degenerative change of the cervical spine, but there is no evidence of an acute fracture nor dislocation.   Electronically Signed   By: David  [place]   On: 08/18/2013 12:56   Ct Cervical Spine Wo Contrast  08/18/2013   CLINICAL DATA:  History of dementia, status post fall now with headache and neck pain  EXAM: CT HEAD WITHOUT CONTRAST  CT CERVICAL SPINE WITHOUT CONTRAST  TECHNIQUE: Multidetector CT imaging of the head and cervical spine was performed following the standard protocol without intravenous contrast. Multiplanar CT image reconstructions of the cervical spine were also generated.  COMPARISON:  CT HEAD W/O CM dated 07/20/2011  FINDINGS: CT HEAD FINDINGS  There is moderate diffuse cerebral and cerebellar atrophy with compensatory ventriculomegaly. There is no evidence of an acute intracranial hemorrhage nor of an evolving ischemic infarction. There is decreased density in the deep white matter of both cerebral hemispheres consistent with chronic small vessel ischemic type change. The cerebellum and brainstem are normal in density. There are no abnormal intracranial calcifications.  At bone window settings the observed portions of the paranasal sinuses and mastoid air cells are clear. There is no cephalohematoma nor evidence of an acute skull fracture.  CT CERVICAL SPINE FINDINGS  There is mild loss of the normal cervical lordosis. The cervical vertebral bodies are preserved in height. There is mild disc space narrowing at C5-6 and at C6-7. Small endplate osteophytes are present at these levels. The prevertebral soft tissue spaces appear normal. There is no evidence of a perched facet or spinous process fracture. There is mild facet joint degenerative change. The bony ring at each cervical level is intact. The odontoid is intact and the lateral masses of C1 align normally with those of C2.  IMPRESSION: 1. There is no evidence  of an acute intracranial hemorrhage nor other acute intracranial abnormality. 2. There is moderate diffuse cerebral and cerebellar atrophy with compensatory ventriculomegaly. This has progressed slightly since the previous study. 3. There are findings consistent with chronic small vessel ischemic type change. 4. There is mild degenerative change of the cervical spine, but there is no evidence of an acute fracture nor dislocation.   Electronically Signed   By: David  [place]   On: 08/18/2013 12:56    EKG: Independently reviewed. Left axis deviation, no sinus rhythm no acute ischemic changes  Assessment/Plan Active Problems:   Hip fracture, right -As discussed above, orthopedics consulted -Placed on analgesics for pain management and follow   DM type 2 (diabetes mellitus, type 2) -Monitor Accu-Cheks Will sliding scale insulin. -Hold off metformin for now as she'll be n.p.o. follow and resume when   Hypokalemia -Replace K. follow and recheck   Anemia, unspecified -Likely chronic, gross bleeding reported follow recheck.   Alzheimer disease -Continue Exelon Left axis deviation -She is chest pain-free and previous EKGs show left anterior fascicular block but no left axis deviation noted -We'll cycle cardiac enzymes and follow. She has no prior cardiac history.     Code Status: Full code Family Communication: None at bedside Disposition Plan: Admit to MedSurg  Time spent: >10mins  Kela Millin [facility] Pager [phone]

## 2013-08-18 NOTE — Consult Note (Signed)
Reason for Consult: Right hip pain Referring Physician:dr viyuoh*  Michelle Sweeney is an 80 y.o. female.  HPI: Michelle Sweeney is a 80-year-old female who is a resident of a memory care unit. She had a fall today. She does get around in a wheelchair and transfers. She had a left hip fracture in 2012. She denies any other orthopedic complaints but does report right hip pain. Denies any loss of consciousness with the fall. No history of DVT or pulmonary embolism  Past Medical History  Diagnosis Date  . IBS (irritable bowel syndrome)   . HTN (hypertension)   . Osteoporosis   . Hyperlipidemia   . Hyperkalemia   . Diabetes mellitus   . Alzheimer disease   . Arthritis     Past Surgical History  Procedure Laterality Date  . Abdominal hysterectomy    . Cardiac catheterization    . Hip arthroplasty  07/22/2011    Procedure: ARTHROPLASTY BIPOLAR HIP;  Surgeon: Wayne Keeling, MD;  Location: [facility];  Service: Orthopedics;  Laterality: Left;    History reviewed. No pertinent family history.  Social History:  reports that she has never smoked. She does not have any smokeless tobacco history on file. She reports that she does not drink alcohol or use illicit drugs.  Allergies:  Allergies  Allergen Reactions  . Penicillins Other (See Comments)    Reaction unknown  . Quinine Derivatives Other (See Comments)    Reaction unknown    Medications: I have reviewed the patient's current medications.  Results for orders placed during the hospital encounter of 08/18/13 (from the past 48 hour(s))  CBC WITH DIFFERENTIAL     Status: Abnormal   Collection Time    08/18/13 12:26 PM      Result Value Range   WBC 4.8  4.0 - 10.5 K/uL   RBC 3.23 (*) 3.87 - 5.11 MIL/uL   Hemoglobin 10.9 (*) 12.0 - 15.0 g/dL   HCT 31.6 (*) 36.0 - 46.0 %   MCV 97.8  78.0 - 100.0 fL   MCH 33.7  26.0 - 34.0 pg   MCHC 34.5  30.0 - 36.0 g/dL   RDW 13.0  11.5 - 15.5 %   Platelets 245  150 - 400 K/uL   Neutrophils Relative % 65  43  - 77 %   Neutro Abs 3.1  1.7 - 7.7 K/uL   Lymphocytes Relative 22  12 - 46 %   Lymphs Abs 1.1  0.7 - 4.0 K/uL   Monocytes Relative 12  3 - 12 %   Monocytes Absolute 0.6  0.1 - 1.0 K/uL   Eosinophils Relative 1  0 - 5 %   Eosinophils Absolute 0.0  0.0 - 0.7 K/uL   Basophils Relative 1  0 - 1 %   Basophils Absolute 0.0  0.0 - 0.1 K/uL  COMPREHENSIVE METABOLIC PANEL     Status: Abnormal   Collection Time    08/18/13 12:26 PM      Result Value Range   Sodium 137  137 - 147 mEq/L   Potassium 3.2 (*) 3.7 - 5.3 mEq/L   Chloride 102  96 - 112 mEq/L   CO2 25  19 - 32 mEq/L   Glucose, Bld 180 (*) 70 - 99 mg/dL   BUN 20  6 - 23 mg/dL   Creatinine, Ser 0.60  0.50 - 1.10 mg/dL   Calcium 8.4  8.4 - 10.5 mg/dL   Total Protein 5.8 (*) 6.0 - 8.3 g/dL     Albumin 2.8 (*) 3.5 - 5.2 g/dL   AST 37  0 - 37 U/L   ALT 22  0 - 35 U/L   Alkaline Phosphatase 142 (*) 39 - 117 U/L   Total Bilirubin 0.3  0.3 - 1.2 mg/dL   GFR calc non Af Amer 84 (*) >90 mL/min   GFR calc Af Amer >90  >90 mL/min   Comment: (NOTE)     The eGFR has been calculated using the CKD EPI equation.     This calculation has not been validated in all clinical situations.     eGFR's persistently <90 mL/min signify possible Chronic Kidney     Disease.  PROTIME-INR     Status: None   Collection Time    08/18/13 12:26 PM      Result Value Range   Prothrombin Time 13.5  11.6 - 15.2 seconds   INR 1.05  0.00 - 1.49    Dg Chest 1 View  08/18/2013   CLINICAL DATA:  Fall, a hip fracture  EXAM: CHEST - 1 VIEW  COMPARISON:  Prior chest x-ray 06/15/2013  FINDINGS: Stable cardiomegaly. Atherosclerotic and ectatic thoracic aorta. Likely ectasia of the ascending segment. Low inspiratory volumes with mild bibasilar atelectasis. Chronic background bronchitic and interstitial changes. The bones appear osteopenic. No acute fracture or malalignment. Degenerative changes noted throughout the thoracic spine.  IMPRESSION: Low inspiratory volumes with mild  bibasilar atelectasis.  Otherwise, stable chest x-ray.   Electronically Signed   By: Heath  McCullough M.D.   On: 08/18/2013 12:59   Dg Hip Complete Right  08/18/2013   CLINICAL DATA:  Fall and hip pain.  EXAM: RIGHT HIP - COMPLETE 2+ VIEW  COMPARISON:  Left hip radiographs 05/24/2013  FINDINGS: There is a mildly displaced intertrochanteric fracture of the right femur with varus angulation. There is no dislocation. Prior left hip arthroplasty is again identified. Left femoral prosthesis remains approximated with the acetabulum. The bones are diffusely osteopenic. Degenerative changes are noted in the visualized lower lumbar spine.  IMPRESSION: Mildly displaced and angulated intertrochanteric right femur fracture.   Electronically Signed   By: Allen  Grady   On: 08/18/2013 12:59   Ct Head Wo Contrast  08/18/2013   CLINICAL DATA:  History of dementia, status post fall now with headache and neck pain  EXAM: CT HEAD WITHOUT CONTRAST  CT CERVICAL SPINE WITHOUT CONTRAST  TECHNIQUE: Multidetector CT imaging of the head and cervical spine was performed following the standard protocol without intravenous contrast. Multiplanar CT image reconstructions of the cervical spine were also generated.  COMPARISON:  CT HEAD W/O CM dated 07/20/2011  FINDINGS: CT HEAD FINDINGS  There is moderate diffuse cerebral and cerebellar atrophy with compensatory ventriculomegaly. There is no evidence of an acute intracranial hemorrhage nor of an evolving ischemic infarction. There is decreased density in the deep white matter of both cerebral hemispheres consistent with chronic small vessel ischemic type change. The cerebellum and brainstem are normal in density. There are no abnormal intracranial calcifications.  At bone window settings the observed portions of the paranasal sinuses and mastoid air cells are clear. There is no cephalohematoma nor evidence of an acute skull fracture.  CT CERVICAL SPINE FINDINGS  There is mild loss of the normal  cervical lordosis. The cervical vertebral bodies are preserved in height. There is mild disc space narrowing at C5-6 and at C6-7. Small endplate osteophytes are present at these levels. The prevertebral soft tissue spaces appear normal. There is no evidence of   a perched facet or spinous process fracture. There is mild facet joint degenerative change. The bony ring at each cervical level is intact. The odontoid is intact and the lateral masses of C1 align normally with those of C2.  IMPRESSION: 1. There is no evidence of an acute intracranial hemorrhage nor other acute intracranial abnormality. 2. There is moderate diffuse cerebral and cerebellar atrophy with compensatory ventriculomegaly. This has progressed slightly since the previous study. 3. There are findings consistent with chronic small vessel ischemic type change. 4. There is mild degenerative change of the cervical spine, but there is no evidence of an acute fracture nor dislocation.   Electronically Signed   By: David  Jordan   On: 08/18/2013 12:56   Ct Cervical Spine Wo Contrast  08/18/2013   CLINICAL DATA:  History of dementia, status post fall now with headache and neck pain  EXAM: CT HEAD WITHOUT CONTRAST  CT CERVICAL SPINE WITHOUT CONTRAST  TECHNIQUE: Multidetector CT imaging of the head and cervical spine was performed following the standard protocol without intravenous contrast. Multiplanar CT image reconstructions of the cervical spine were also generated.  COMPARISON:  CT HEAD W/O CM dated 07/20/2011  FINDINGS: CT HEAD FINDINGS  There is moderate diffuse cerebral and cerebellar atrophy with compensatory ventriculomegaly. There is no evidence of an acute intracranial hemorrhage nor of an evolving ischemic infarction. There is decreased density in the deep white matter of both cerebral hemispheres consistent with chronic small vessel ischemic type change. The cerebellum and brainstem are normal in density. There are no abnormal intracranial  calcifications.  At bone window settings the observed portions of the paranasal sinuses and mastoid air cells are clear. There is no cephalohematoma nor evidence of an acute skull fracture.  CT CERVICAL SPINE FINDINGS  There is mild loss of the normal cervical lordosis. The cervical vertebral bodies are preserved in height. There is mild disc space narrowing at C5-6 and at C6-7. Small endplate osteophytes are present at these levels. The prevertebral soft tissue spaces appear normal. There is no evidence of a perched facet or spinous process fracture. There is mild facet joint degenerative change. The bony ring at each cervical level is intact. The odontoid is intact and the lateral masses of C1 align normally with those of C2.  IMPRESSION: 1. There is no evidence of an acute intracranial hemorrhage nor other acute intracranial abnormality. 2. There is moderate diffuse cerebral and cerebellar atrophy with compensatory ventriculomegaly. This has progressed slightly since the previous study. 3. There are findings consistent with chronic small vessel ischemic type change. 4. There is mild degenerative change of the cervical spine, but there is no evidence of an acute fracture nor dislocation.   Electronically Signed   By: David  Jordan   On: 08/18/2013 12:56    Review of Systems  Constitutional: Negative.   HENT: Negative.   Eyes: Negative.   Respiratory: Negative.   Cardiovascular: Negative.   Gastrointestinal: Negative.   Genitourinary: Negative.   Musculoskeletal: Positive for joint pain.  Skin: Negative.   Neurological: Negative.   Endo/Heme/Allergies: Negative.   Psychiatric/Behavioral: Positive for memory loss.   Blood pressure 140/80, pulse 79, temperature 98.4 F (36.9 C), temperature source Oral, resp. rate 16, height 5' 4" (1.626 m), weight 55.929 kg (123 lb 4.8 oz), SpO2 99.00%. Physical Exam  Constitutional: She appears well-developed.  HENT:  Head: Normocephalic.  Eyes: Pupils are  equal, round, and reactive to light.  Neck: Normal range of motion.  Cardiovascular: Normal rate.     Respiratory: Effort normal.  Neurological: She is alert.  Skin: Skin is warm.  Psychiatric: She has a normal mood and affect.   examination of her extremities demonstrates regional range of motion in the elbows shoulders and wrists. Radial pulse intact bilaterally. Her right leg is slightly shortened and externally rotated. Toes are mobile and perfused and sensate. No knee effusion bilaterally. No left hip pain with range of motion.  Assessment/Plan: Impression is right hip intertrochanteric fracture in a patient who does stand pivot and transfer from ability. Her albumin is low at 2.8. Has a history of non-insulin-dependent diabetes and her glucose is high today at 180. Hemoglobin slightly low at approximately 10 plan is for intramedullary hip screw risks and benefits discussed with the patient's daughter including not limited to infection nerve vessel damage incomplete healing need for surgery. The relatively high mortality as well the first 3 months is also discussed patient's in this age group with medical comorbidities such as hers. Nonetheless in order to improve mobility decreased pain plan is for pin fixation all questions answered  DEAN,GREGORY SCOTT 08/18/2013, 4:42 PM      

## 2013-08-18 NOTE — ED Provider Notes (Signed)
CSN: [id number]     Arrival date & time 08/18/13  1150 History   First MD Initiated Contact with Patient 08/18/13 1152     Chief Complaint  Patient presents with  . Fall  . Hip Pain   (Consider location/radiation/quality/duration/timing/severity/associated sxs/prior Treatment) HPI Comments: Patient is a 80 year old female with history of dementia. She is a resident of [facility]. She was brought here today after a fall. This fall was unwitnessed but she has been complaining of severe pain in her right hip since that time. She is unable to add any additional information due to her dementia. She was having significant discomfort with any movement and was given fentanyl by EMS.  Patient is a 80 y.o. female presenting with fall. The history is provided by the patient.  Fall This is a new problem. The current episode started 1 to 2 hours ago. The problem occurs constantly. The problem has not changed since onset.Associated symptoms comments: Hip pain. Exacerbated by: Movement and palpation. Nothing relieves the symptoms.    Past Medical History  Diagnosis Date  . IBS (irritable bowel syndrome)   . HTN (hypertension)   . Osteoporosis   . Hyperlipidemia   . Hyperkalemia   . Diabetes mellitus   . Alzheimer disease   . Arthritis    Past Surgical History  Procedure Laterality Date  . Abdominal hysterectomy    . Cardiac catheterization    . Hip arthroplasty  07/22/2011    Procedure: ARTHROPLASTY BIPOLAR HIP;  Surgeon: Darreld Mclean, MD;  Location: [facility];  Service: Orthopedics;  Laterality: Left;   No family history on file. History  Substance Use Topics  . Smoking status: Never Smoker   . Smokeless tobacco: Not on file  . Alcohol Use: No   OB History   Grav Para Term Preterm Abortions TAB SAB Ect Mult Living                 Review of Systems  All other systems reviewed and are negative.    Allergies  Penicillins and Quinine derivatives  Home Medications    Current Outpatient Rx  Name  Route  Sig  Dispense  Refill  . acetaminophen (TYLENOL) 325 MG tablet   Oral   Take 650 mg by mouth 3 (three) times daily.         Marland Kitchen acetaminophen (TYLENOL) 500 MG tablet   Oral   Take 500 mg by mouth every 4 (four) hours as needed for mild pain, fever or headache (for fever over 101 degress, headache or minor discomfort).         Marland Kitchen albuterol (ACCUNEB) 0.63 MG/3ML nebulizer solution   Nebulization   Take 1 ampule by nebulization 2 (two) times daily.          Marland Kitchen ALPRAZolam (XANAX) 0.25 MG tablet   Oral   Take 0.25 mg by mouth every 8 (eight) hours as needed for anxiety.         Marland Kitchen alum & mag hydroxide-simeth (MAALOX/MYLANTA) 200-200-20 MG/5ML suspension   Oral   Take 30 mLs by mouth every 6 (six) hours as needed for indigestion.         . Artificial Tear Solution (SYSTANE CONTACTS) SOLN   Right Eye   Place 1 drop into the right eye 3 (three) times daily.          . bimatoprost (LUMIGAN) 0.01 % SOLN   Both Eyes   Place 1 drop into both eyes at bedtime.          Marland Kitchen  cholecalciferol (VITAMIN D) 1000 UNITS tablet   Oral   Take 1,000 Units by mouth every morning.          Marland Kitchen Dextromethorphan-Guaifenesin (SILTUSSIN DM ALCOHOL FREE) 10-100 MG/5ML liquid   Oral   Take 10 mLs by mouth every 4 (four) hours as needed (Cough).         . feeding supplement (PRO-STAT SUGAR FREE 64) LIQD   Oral   Take 30 mLs by mouth 3 (three) times daily with meals.         Marland Kitchen guaifenesin (ROBITUSSIN) 100 MG/5ML syrup   Oral   Take 200 mg by mouth every 6 (six) hours as needed for cough.          Marland Kitchen ipratropium (ATROVENT) 0.02 % nebulizer solution   Nebulization   Take 500 mcg by nebulization every 6 (six) hours.         Marland Kitchen loperamide (IMODIUM) 2 MG capsule   Oral   Take 2 mg by mouth 4 (four) times daily as needed for diarrhea or loose stools. Not to exceed 8 doses in 24hr         . LORazepam (ATIVAN) 0.5 MG tablet   Oral   Take 0.25 mg by  mouth 4 (four) times daily.         . magnesium hydroxide (MILK OF MAGNESIA) 400 MG/5ML suspension   Oral   Take 30 mLs by mouth at bedtime as needed for constipation.         . metFORMIN (GLUCOPHAGE) 500 MG tablet   Oral   Take 250 mg by mouth every evening.          Marland Kitchen EXPIRED: mirtazapine (REMERON) 15 MG tablet   Oral   Take 7.5-15 mg by mouth at bedtime. Take 7.5 mg for seven days then continue on 15mg  daily         . omeprazole (PRILOSEC) 40 MG capsule   Oral   Take 40 mg by mouth every morning.          . Probiotic Product (RISA-BID PROBIOTIC PO)   Oral   Take 1 tablet by mouth every morning.          . promethazine (PHENERGAN) 12.5 MG tablet   Oral   Take 12.5 mg by mouth every 6 (six) hours as needed for nausea.         . rivastigmine (EXELON) 9.5 mg/24hr   Transdermal   Place 1 patch onto the skin daily. Remove used patches before applying a new patch         . traMADol (ULTRAM) 50 MG tablet   Oral   Take 50 mg by mouth every 6 (six) hours as needed for moderate pain.          . traMADol (ULTRAM) 50 MG tablet   Oral   Take 50 mg by mouth 2 (two) times daily.           There were no vitals taken for this visit. Physical Exam  Nursing note and vitals reviewed. Constitutional: She is oriented to person, place, and time. No distress.  Patient is an elderly female with history of dementia. She is awake, alert, but not oriented to time, person, or situation.  HENT:  Head: Normocephalic and atraumatic.  Eyes: EOM are normal. Pupils are equal, round, and reactive to light.  Neck: Normal range of motion. Neck supple.  Cardiovascular: Normal rate and regular rhythm.  Exam reveals no gallop and no friction rub.  No murmur heard. Pulmonary/Chest: Effort normal and breath sounds normal. No respiratory distress. She has no wheezes.  Abdominal: Soft. Bowel sounds are normal. She exhibits no distension. There is no tenderness.  Musculoskeletal: Normal  range of motion.  There is tenderness to palpation over the right hip. There is severe discomfort with any movement. Distal motor and pulses are intact.  Neurological: She is alert and oriented to person, place, and time.  Skin: Skin is warm and dry. She is not diaphoretic.    ED Course  Procedures (including critical care time) Labs Review Labs Reviewed  CBC WITH DIFFERENTIAL  COMPREHENSIVE METABOLIC PANEL  PROTIME-INR   Imaging Review No results found.  EKG Interpretation    Date/Time:  Friday August 18 2013 14:00:56 EST Ventricular Rate:  77 PR Interval:  191 QRS Duration: 85 QT Interval:  431 QTC Calculation: 488 R Axis:   -31 Text Interpretation:  Sinus or ectopic atrial rhythm Left axis deviation Nonspecific T abnormalities, lateral leads Borderline prolonged QT interval Confirmed by DELOS  MD, Leam Madero ([id number]) on 08/18/2013 2:04:01 PM            MDM  No diagnosis found. Patient is a 80 year old female with history of dementia. She was brought here for evaluation of hip pain following a fall at the memory care facility. Workup reveals an intertrochanteric right hip fracture. I have discussed this with Dr. August Saucerean from orthopedics who recommends admission to medicine. I've spoken with Dr. Donna BernardViyouh from [facility] who agrees to admit the patient. Due to her street dementia and inability to add additional history, CT of the head and cervical spine were obtained which were unremarkable. Her laboratory studies were essentially unremarkable as well.    Geoffery Lyonsouglas Lashaun Krapf, MD 08/18/13 [id number]

## 2013-08-18 NOTE — Preoperative (Signed)
Beta Blockers   Reason not to administer Beta Blockers:Not Applicable 

## 2013-08-18 NOTE — Progress Notes (Signed)
   CARE MANAGEMENT ED NOTE 08/18/2013  Patient:  Michelle JanskySMITH,Michelle M   Account Number:  [id number]  Date Initiated:  08/18/2013  Documentation initiated by:  Edd ArbourGIBBS,KIMBERLY  Subjective/Objective Assessment:   [id number] [address] [facility] [place] pt with dx hip fracture [facility] hx dementia MD listed on facility sheet as samuel bowen  pcp terry g daniel     Subjective/Objective Assessment Detail:     Action/Plan:   Action/Plan Detail:   Anticipated DC Date:  08/21/2013     Status Recommendation to Physician:   Result of Recommendation:    Other ED Services  Consult Working Plan    DC Planning Services  Other  PCP issues    Choice offered to / List presented to:            Status of service:  Completed, signed off  ED Comments:   ED Comments Detail:

## 2013-08-18 NOTE — ED Notes (Signed)
Larey SeatFell this am, [facility] and has pain to rt hip unsure if loc or hit head. Alert to person. Saline lock 20g iv rt ac with 150mcg of fentynl given in route.

## 2013-08-18 NOTE — ED Notes (Signed)
Staff was in room when admit floor called and nurse called back and she will have to return call. Was getting report from another nurse.

## 2013-08-18 NOTE — ED Notes (Signed)
Bed: WA15 Expected date:  Expected time:  Means of arrival:  Comments: EMS/fall/hip pain 

## 2013-08-19 ENCOUNTER — Inpatient Hospital Stay (HOSPITAL_COMMUNITY): Payer: Medicare Other

## 2013-08-19 DIAGNOSIS — D649 Anemia, unspecified: Secondary | ICD-10-CM

## 2013-08-19 DIAGNOSIS — S72009A Fracture of unspecified part of neck of unspecified femur, initial encounter for closed fracture: Secondary | ICD-10-CM

## 2013-08-19 DIAGNOSIS — G309 Alzheimer's disease, unspecified: Secondary | ICD-10-CM

## 2013-08-19 DIAGNOSIS — F0280 Dementia in other diseases classified elsewhere without behavioral disturbance: Secondary | ICD-10-CM

## 2013-08-19 DIAGNOSIS — E119 Type 2 diabetes mellitus without complications: Secondary | ICD-10-CM

## 2013-08-19 LAB — CBC
HEMATOCRIT: 29.9 % — AB (ref 36.0–46.0)
Hemoglobin: 10.2 g/dL — ABNORMAL LOW (ref 12.0–15.0)
MCH: 33.3 pg (ref 26.0–34.0)
MCHC: 34.1 g/dL (ref 30.0–36.0)
MCV: 97.7 fL (ref 78.0–100.0)
Platelets: 256 10*3/uL (ref 150–400)
RBC: 3.06 MIL/uL — ABNORMAL LOW (ref 3.87–5.11)
RDW: 13.4 % (ref 11.5–15.5)
WBC: 6.9 10*3/uL (ref 4.0–10.5)

## 2013-08-19 LAB — BASIC METABOLIC PANEL
BUN: 18 mg/dL (ref 6–23)
CHLORIDE: 104 meq/L (ref 96–112)
CO2: 20 mEq/L (ref 19–32)
CREATININE: 0.5 mg/dL (ref 0.50–1.10)
Calcium: 7.4 mg/dL — ABNORMAL LOW (ref 8.4–10.5)
GFR calc non Af Amer: 90 mL/min — ABNORMAL LOW (ref >90)
GLUCOSE: 167 mg/dL — AB (ref 70–99)
Potassium: 2.9 mEq/L — CL (ref 3.7–5.3)
Sodium: 138 mEq/L (ref 137–147)

## 2013-08-19 LAB — PROTIME-INR
INR: 1.14 (ref 0.00–1.49)
Prothrombin Time: 14.4 seconds (ref 11.6–15.2)

## 2013-08-19 LAB — GLUCOSE, CAPILLARY
GLUCOSE-CAPILLARY: 145 mg/dL — AB (ref 70–99)
GLUCOSE-CAPILLARY: 152 mg/dL — AB (ref 70–99)
Glucose-Capillary: 136 mg/dL — ABNORMAL HIGH (ref 70–99)
Glucose-Capillary: 132 mg/dL — ABNORMAL HIGH (ref 70–99)
Glucose-Capillary: 155 mg/dL — ABNORMAL HIGH (ref 70–99)

## 2013-08-19 LAB — TROPONIN I: Troponin I: <0.3 ng/mL (ref <0.30)

## 2013-08-19 MED ORDER — PHENYLEPHRINE HCL 10 MG/ML IJ SOLN
INTRAMUSCULAR | Status: AC
  Filled 2013-08-19: qty 1

## 2013-08-19 MED ORDER — LORAZEPAM 2 MG/ML IJ SOLN
1.0000 mg | Freq: Once | INTRAMUSCULAR | Status: AC
  Administered 2013-08-20: 1 mg via INTRAVENOUS
  Filled 2013-08-19: qty 1

## 2013-08-19 MED ORDER — BUPIVACAINE HCL (PF) 0.75 % IJ SOLN
INTRAMUSCULAR | Status: DC | PRN
  Administered 2013-08-19: 1.4 mL

## 2013-08-19 MED ORDER — PHENYLEPHRINE 40 MCG/ML (10ML) SYRINGE FOR IV PUSH (FOR BLOOD PRESSURE SUPPORT)
PREFILLED_SYRINGE | INTRAVENOUS | Status: AC
  Filled 2013-08-19: qty 10

## 2013-08-19 MED ORDER — PROPOFOL INFUSION 10 MG/ML OPTIME
INTRAVENOUS | Status: DC | PRN
  Administered 2013-08-19: 100 ug/kg/min via INTRAVENOUS

## 2013-08-19 MED ORDER — CLINDAMYCIN PHOSPHATE 900 MG/50ML IV SOLN
INTRAVENOUS | Status: DC | PRN
  Administered 2013-08-19: 900 mg via INTRAVENOUS

## 2013-08-19 MED ORDER — 0.9 % SODIUM CHLORIDE (POUR BTL) OPTIME
TOPICAL | Status: DC | PRN
  Administered 2013-08-19: 1000 mL

## 2013-08-19 MED ORDER — HYDROCODONE-ACETAMINOPHEN 5-325 MG PO TABS
1.0000 | ORAL_TABLET | Freq: Four times a day (QID) | ORAL | Status: DC | PRN
  Administered 2013-08-19 – 2013-08-22 (×7): 1 via ORAL
  Filled 2013-08-19 (×7): qty 1

## 2013-08-19 MED ORDER — ACETAMINOPHEN 325 MG PO TABS
650.0000 mg | ORAL_TABLET | Freq: Four times a day (QID) | ORAL | Status: DC | PRN
  Administered 2013-08-22: 650 mg via ORAL
  Filled 2013-08-19: qty 2

## 2013-08-19 MED ORDER — POTASSIUM CHLORIDE CRYS ER 20 MEQ PO TBCR
30.0000 meq | EXTENDED_RELEASE_TABLET | ORAL | Status: AC
  Administered 2013-08-19 (×2): 30 meq via ORAL
  Filled 2013-08-19 (×2): qty 1

## 2013-08-19 MED ORDER — PHENYLEPHRINE HCL 10 MG/ML IJ SOLN
INTRAMUSCULAR | Status: DC | PRN
  Administered 2013-08-19 (×2): 80 ug via INTRAVENOUS
  Administered 2013-08-19: 120 ug via INTRAVENOUS

## 2013-08-19 MED ORDER — POTASSIUM CHLORIDE IN NACL 20-0.9 MEQ/L-% IV SOLN
INTRAVENOUS | Status: AC
  Filled 2013-08-19: qty 1000

## 2013-08-19 MED ORDER — ONDANSETRON HCL 4 MG/2ML IJ SOLN: 4.0000 mg | Freq: Once | INTRAMUSCULAR | Status: AC | PRN

## 2013-08-19 MED ORDER — METOCLOPRAMIDE HCL 10 MG PO TABS: 5.0000 mg | ORAL_TABLET | Freq: Three times a day (TID) | ORAL | Status: DC | PRN

## 2013-08-19 MED ORDER — MORPHINE SULFATE 2 MG/ML IJ SOLN
0.5000 mg | INTRAMUSCULAR | Status: DC | PRN
  Administered 2013-08-19: 0.5 mg via INTRAVENOUS
  Filled 2013-08-19: qty 1

## 2013-08-19 MED ORDER — POTASSIUM CHLORIDE 10 MEQ/100ML IV SOLN
10.0000 meq | INTRAVENOUS | Status: AC
  Administered 2013-08-19 (×3): 10 meq via INTRAVENOUS
  Filled 2013-08-19 (×3): qty 100

## 2013-08-19 MED ORDER — ONDANSETRON HCL 4 MG PO TABS: 4.0000 mg | ORAL_TABLET | Freq: Four times a day (QID) | ORAL | Status: DC | PRN

## 2013-08-19 MED ORDER — MENTHOL 3 MG MT LOZG
1.0000 | LOZENGE | OROMUCOSAL | Status: DC | PRN
  Filled 2013-08-19: qty 9

## 2013-08-19 MED ORDER — FENTANYL CITRATE 0.05 MG/ML IJ SOLN: 25.0000 ug | INTRAMUSCULAR | Status: DC | PRN

## 2013-08-19 MED ORDER — MEPERIDINE HCL 25 MG/ML IJ SOLN: 6.2500 mg | INTRAMUSCULAR | Status: DC | PRN

## 2013-08-19 MED ORDER — PHENOL 1.4 % MT LIQD
1.0000 | OROMUCOSAL | Status: DC | PRN
  Filled 2013-08-19: qty 177

## 2013-08-19 MED ORDER — SODIUM CHLORIDE 0.9 % IV SOLN
10.0000 mg | INTRAVENOUS | Status: DC | PRN
  Administered 2013-08-19: 50 ug/min via INTRAVENOUS

## 2013-08-19 MED ORDER — ONDANSETRON HCL 4 MG/2ML IJ SOLN: 4.0000 mg | Freq: Four times a day (QID) | INTRAMUSCULAR | Status: DC | PRN

## 2013-08-19 MED ORDER — CLINDAMYCIN PHOSPHATE 600 MG/50ML IV SOLN
600.0000 mg | Freq: Three times a day (TID) | INTRAVENOUS | Status: AC
  Administered 2013-08-19 (×2): 600 mg via INTRAVENOUS
  Filled 2013-08-19 (×3): qty 50

## 2013-08-19 MED ORDER — POTASSIUM CHLORIDE IN NACL 20-0.9 MEQ/L-% IV SOLN
INTRAVENOUS | Status: DC
  Administered 2013-08-19: 04:00:00 via INTRAVENOUS
  Filled 2013-08-19 (×2): qty 1000

## 2013-08-19 MED ORDER — ACETAMINOPHEN 650 MG RE SUPP: 650.0000 mg | Freq: Four times a day (QID) | RECTAL | Status: DC | PRN

## 2013-08-19 MED ORDER — METOCLOPRAMIDE HCL 5 MG/ML IJ SOLN: 5.0000 mg | Freq: Three times a day (TID) | INTRAMUSCULAR | Status: DC | PRN

## 2013-08-19 MED ORDER — LACTATED RINGERS IV SOLN: INTRAVENOUS | Status: DC

## 2013-08-19 MED ORDER — METFORMIN HCL 500 MG PO TABS
250.0000 mg | ORAL_TABLET | Freq: Every day | ORAL | Status: DC
  Administered 2013-08-20 – 2013-08-21 (×2): 250 mg via ORAL
  Filled 2013-08-19 (×4): qty 1

## 2013-08-19 MED ORDER — ASPIRIN EC 325 MG PO TBEC
325.0000 mg | DELAYED_RELEASE_TABLET | Freq: Every day | ORAL | Status: DC
  Administered 2013-08-19 – 2013-08-22 (×4): 325 mg via ORAL
  Filled 2013-08-19 (×5): qty 1

## 2013-08-19 NOTE — Anesthesia Postprocedure Evaluation (Signed)
  Anesthesia Post-op Note  Patient: Michelle Sweeney  Procedure(s) Performed: Procedure(s): INTRAMEDULLARY (IM) NAIL INTERTROCHANTRIC (Right) Patient is awake and responsive. Pain and nausea are reasonably well controlled. Vital signs are stable and clinically acceptable. Oxygen saturation is clinically acceptable. There are no apparent anesthetic complications at this time. Patient is ready for discharge.   

## 2013-08-19 NOTE — Progress Notes (Signed)
Subjective: Pt stable - more comfortable this am   Objective: Vital signs in last 24 hours: Temp:  [97.5 F (36.4 C)-100.2 F (37.9 C)] 98.9 F (37.2 C) (02/07 1000) Pulse Rate:  [71-108] 96 (02/07 1000) Resp:  [14-21] 18 (02/07 1000) BP: (89-140)/(37-84) 113/56 mmHg (02/07 1000) SpO2:  [96 %-100 %] 100 % (02/07 1000) Weight:  [55.929 kg (123 lb 4.8 oz)] 55.929 kg (123 lb 4.8 oz) (02/06 1607)  Intake/Output from previous day: 02/06 0701 - 02/07 0700 In: 1488.8 [P.O.:60; I.V.:1428.8] Out: 700 [Urine:600; Blood:100] Intake/Output this shift: Total I/O In: 120 [P.O.:120] Out: -   Exam:  Intact pulses distally Dorsiflexion/Plantar flexion intact Compartment soft  Labs:  Recent Labs  08/18/13 1226 08/19/13 0511  HGB 10.9* 10.2*    Recent Labs  08/18/13 1226 08/19/13 0511  WBC 4.8 6.9  RBC 3.23* 3.06*  HCT 31.6* 29.9*  PLT 245 256    Recent Labs  08/18/13 1226 08/19/13 0511  NA 137 138  K 3.2* 2.9*  CL 102 104  CO2 25 20  BUN 20 18  CREATININE 0.60 0.50  GLUCOSE 180* 167*  CALCIUM 8.4 7.4*    Recent Labs  08/18/13 1226 08/19/13 0511  INR 1.05 1.14    Assessment/Plan: Plan to mobilize today with PT pwb for transfers   [facility] Medical CampusDEAN,Michelle Sweeney 08/19/2013, 12:06 PM

## 2013-08-19 NOTE — Transfer of Care (Signed)
Immediate Anesthesia Transfer of Care Note  Patient: Michelle Sweeney  Procedure(s) Performed: Procedure(s): INTRAMEDULLARY (IM) NAIL INTERTROCHANTRIC (Right)  Patient Location: PACU  Anesthesia Type:Spinal  Level of Consciousness: sedated  Airway & Oxygen Therapy: Patient Spontanous Breathing and Patient connected to face mask oxygen  Post-op Assessment: Report given to PACU RN and Post -op Vital signs reviewed and stable  Post vital signs: Reviewed and stable  Complications: No apparent anesthesia complications

## 2013-08-19 NOTE — Progress Notes (Signed)
OT Cancellation Note  Patient Details Name: Michelle Sweeney MRN: [id number] DOB: 02/28/1933   Cancelled Treatment:    Reason Eval/Treat Not Completed: OT screened, no needs identified, will sign off.  Pt resides in a memory care unit due to advanced dementia.  She is dependent in self care at baseline.  Plan is for SNF placement for rehab.  Will defer OT to SNF's discretion.  Evern BioMayberry, Michelle Sweeney 08/19/2013, 1:11 PM

## 2013-08-19 NOTE — Progress Notes (Signed)
CRITICAL VALUE ALERT  Critical value received:  Potassium 2.9  Date of notification:  Feb. 7, 2015  Time of notification:  0629  Critical value read back: Yes  Nurse who received alert:  Garlan FairShanice Vennable RN  MD notified (1st page):  Elray McgregorMary Lynch NP  Time of first page:  0630  MD notified (2nd page):  Time of second page:  Responding MD:  Elray McgregorMary Lynch NP  Time MD responded: [phone]

## 2013-08-19 NOTE — Progress Notes (Signed)
Clinical Social Work Department BRIEF PSYCHOSOCIAL ASSESSMENT 08/19/2013  Patient:  Michelle Sweeney,Michelle Sweeney     Account Number:  [id number]     Admit date:  08/18/2013  Clinical Social Worker:  Doroteo GlassmanSIMPSON,AMANDA, LCSWA  Date/Time:  08/19/2013 11:41 AM  Referred by:  Physician  Date Referred:  08/19/2013 Referred for  SNF Placement   Other Referral:   Interview type:  Other - See comment Other interview type:   Pt's daughter via phone    PSYCHOSOCIAL DATA Living Status:  FACILITY Admitted from facility:   Level of care:   Primary support name:  Mrs. Hart RochesterLawson Primary support relationship to patient:  CHILD, ADULT Degree of support available:   strong    CURRENT CONCERNS Current Concerns  Post-Acute Placement   Other Concerns:    SOCIAL WORK ASSESSMENT / PLAN Spoke with Pt's daughter via phone re: d/c plans.    Pt's daughter stated that Pt is [facility].  Pt has [facility] to [facility] in the past for rehab, however Pt's daughter is [facility], as she has had family members there and like the care they received.    Pt's daughter did agreed to allow CSW to do a SNF search county-wide, whereupon Pt's daughter asked CSW to [facility].  Pt's daughter stated that she's not too [place] the other facilities [place] and that she'd like to see what her options are in [place]    CSW thanked Pt's daughter for her time.   Assessment/plan status:  Psychosocial Support/Ongoing Assessment of Needs Other assessment/ plan:   Information/referral to community resources:   CSW to provide a SNF list    PATIENT'S/FAMILY'S RESPONSE TO PLAN OF CARE: Pt's daughter seemed anxious, as she is concerned about her mom's d/c and her d/c'ing too early.  CSW provided emotional support and encouraged Pt's daughter to discuss her concerns with the MD and RN.    Pt's daughter is fine with Pt going to SNF and agrees that this is necessary upon d/c.     Pt's daughter thanked CSW for time and assistance.   Providence CrosbyAmanda Wataru Mccowen, LCSWA Clinical Social Work [phone]

## 2013-08-19 NOTE — Progress Notes (Signed)
Clinical Social Work Department CLINICAL SOCIAL WORK PLACEMENT NOTE 08/19/2013  Patient:  Michelle Sweeney,Michelle Sweeney  Account Number:  [id number] Admit date:  08/18/2013  Clinical Social Worker:  Doroteo GlassmanAMANDA Yani Lal, LCSWA  Date/time:  08/19/2013 11:49 AM  Clinical Social Work is seeking post-discharge placement for this patient at the following level of care:   SKILLED NURSING   (*CSW will update this form in Epic as items are completed)   08/19/2013  Patient/family provided with [facility] Department of Clinical Social Work's list of facilities offering this level of care within the geographic area requested by the patient (or if unable, by the patient's family).  08/19/2013  Patient/family informed of their freedom to choose among providers that offer the needed level of care, that participate in Medicare, Medicaid or managed care program needed by the patient, have an available bed and are willing to accept the patient.  08/19/2013  Patient/family informed of [facility]' ownership interest in [facility], as well as of the fact that they are under no obligation to receive care at this facility.  PASARR submitted to EDS on existing PASARR number received from EDS on   FL2 transmitted to all facilities in geographic area requested by pt/family on  08/19/2013 FL2 transmitted to all facilities within larger geographic area on   Patient informed that his/her managed care company has contracts with or will negotiate with  certain facilities, including the following:     Patient/family informed of bed offers received:   Patient chooses bed at  Physician recommends and patient chooses bed at    Patient to be transferred to  on   Patient to be transferred to facility by   The following physician request were entered in Epic:   Additional Comments:  Providence CrosbyAmanda Ayrton Mcvay, Theresia MajorsLCSWA Clinical Social Work [phone]

## 2013-08-19 NOTE — Evaluation (Signed)
Physical Therapy Evaluation Patient Details Name: Michelle JanskyJudith M Sweeney MRN: [id number] DOB: 01/18/1933 Today's Date: 08/19/2013 Time: [id number] PT Time Calculation (min): 32 min  PT Assessment / Plan / Recommendation History of Present Illness     Clinical Impression  Pt s/p R femur fx with IM nail repair presents with decreased R LE strength/ROM, advanced dementia, post op pain and 25% PWB limiting functional mobility.  Pt will require follow rehab at SNF level/    PT Assessment  Patient needs continued PT services    Follow Up Recommendations  SNF    Does the patient have the potential to tolerate intense rehabilitation      Barriers to Discharge        Equipment Recommendations  None recommended by PT    Recommendations for Other Services OT consult   Frequency Min 3X/week    Precautions / Restrictions Precautions Precautions: Fall Restrictions Weight Bearing Restrictions: Yes RLE Weight Bearing: Partial weight bearing RLE Partial Weight Bearing Percentage or Pounds: 25% Other Position/Activity Restrictions: Pt with advanced dementia and unable to follow PWB   Pertinent Vitals/Pain Pt c/o R upper leg pain but has not understanding why      Mobility  Bed Mobility Overal bed mobility: +2 for physical assistance;+ 2 for safety/equipment General bed mobility comments: cues for sequence and use of L LE to self assist.  Pad utilized to bring pt to EOB and total assist to move pt sit to supine Transfers Overall transfer level: Needs assistance Equipment used: Rolling walker (2 wheeled) Transfers: Sit to/from [employer]Stand;Stand Pivot Transfers Sit to Stand: +2 physical assistance;+2 safety/equipment;Max assist Stand pivot transfers: Max assist;+2 physical assistance;+2 safety/equipment General transfer comment: cues for use of UEs and LE management.  Pt impulsive and following min cues.  WB ltd only 2* discomfort not pt understanding of PWB 25% Ambulation/Gait Ambulation/Gait  assistance:  (not testes 2* pt inability to understand PWB 25%)    Exercises     PT Diagnosis: Difficulty walking  PT Problem List: Decreased strength;Decreased range of motion;Decreased activity tolerance;Decreased mobility;Decreased balance;Decreased cognition;Decreased knowledge of use of DME;Decreased safety awareness;Decreased knowledge of precautions;Pain PT Treatment Interventions: DME instruction;Gait training;Functional mobility training;Therapeutic activities;Therapeutic exercise;Patient/family education     PT Goals(Current goals can be found in the care plan section) Acute Rehab PT Goals PT Goal Formulation: Patient unable to participate in goal setting Time For Goal Achievement: 09/02/13 Potential to Achieve Goals: Poor  Visit Information  Last PT Received On: 08/19/13 Assistance Needed: +2       Prior Functioning  Home Living Family/patient expects to be discharged to:: Other (Comment) Additional Comments: Pt demented Prior Function Level of Independence: Needs assistance Communication Communication: Other (comment) (demented)    Cognition  Cognition Arousal/Alertness: Awake/alert Behavior During Therapy: Restless;Anxious;Impulsive Overall Cognitive Status: History of cognitive impairments - at baseline    Extremity/Trunk Assessment Upper Extremity Assessment Upper Extremity Assessment: Overall WFL for tasks assessed Lower Extremity Assessment Lower Extremity Assessment: RLE deficits/detail Cervical / Trunk Assessment Cervical / Trunk Assessment: Kyphotic   Balance    End of Session PT - End of Session Equipment Utilized During Treatment: Gait belt Activity Tolerance: Patient limited by pain;Other (comment) Patient left: in bed;with call bell/phone within reach;with bed alarm set Nurse Communication: Mobility status  GP     Guadalupe Kerekes 08/19/2013, 12:34 PM

## 2013-08-19 NOTE — Brief Op Note (Signed)
08/18/2013 - 08/19/2013  1:12 AM  PATIENT:  Michelle Sweeney  80 y.o. female  PRE-OPERATIVE DIAGNOSIS:  femur fracture  POST-OPERATIVE DIAGNOSIS:  femur fracture  PROCEDURE:  Procedure(s): INTRAMEDULLARY (IM) NAIL INTERTROCHANTRIC  SURGEON:  Surgeon(s): Cammy CopaGregory Scott Treyshaun Keatts, MD  ASSISTANT:   ANESTHESIA:   spinal  EBL: 100 ml    Total I/O In: -  Out: 100 [Blood:100]  BLOOD ADMINISTERED: none  DRAINS: none   LOCAL MEDICATIONS USED:  none  SPECIMEN:  No Specimen  COUNTS:  YES  TOURNIQUET:  * No tourniquets in log *  DICTATION: .Other Dictation: Dictation Number ([id number]  PLAN OF CARE: Admit to inpatient   PATIENT DISPOSITION:  PACU - hemodynamically stable

## 2013-08-19 NOTE — Progress Notes (Signed)
Notified Michelle McgregorMary Lynch NP about critical potassium of 2.9. Will continue to monitor.

## 2013-08-19 NOTE — Anesthesia Procedure Notes (Signed)
Spinal  Patient location during procedure: OR Preanesthetic Checklist Completed: patient identified, site marked, surgical consent, pre-op evaluation, timeout performed, IV checked, risks and benefits discussed and monitors and equipment checked Spinal Block Patient position: right lateral decubitus Prep: DuraPrep Patient monitoring: heart rate, cardiac monitor, continuous pulse ox and blood pressure Approach: midline Location: L3-4 Injection technique: single-shot Needle Needle type: Sprotte and Quincke  Needle gauge: 22 G Needle length: 9 cm Assessment Sensory level: T8 Additional Notes Spinal Dosage in OR  Bupivicaine ml       1.4

## 2013-08-19 NOTE — Anesthesia Postprocedure Evaluation (Signed)
  Anesthesia Post-op Note  Patient: Michelle JanskyJudith M Sweeney  Procedure(s) Performed: Procedure(s): INTRAMEDULLARY (IM) NAIL INTERTROCHANTRIC (Right) Patient is awake and responsive. Pain and nausea are reasonably well controlled. Vital signs are stable and clinically acceptable. Oxygen saturation is clinically acceptable. There are no apparent anesthetic complications at this time. Patient is ready for discharge.

## 2013-08-19 NOTE — Progress Notes (Signed)
[facility] PROGRESS NOTE  Michelle JanskyJudith M Sweeney WGN:[id number] DOB: 08/11/1932 DOA: 08/18/2013 PCP: Donzetta SprungANIEL, TERRY, MD  Assessment/Plan: Hip fracture, right  -As discussed above, per orthopedics  -Status post surgery/ intramedullary nail intertrochanteric DM type 2 (diabetes mellitus, type 2)  -continue monitoring Accu-Cheks, cover with sliding scale insulin.  -Resume metformin  Hypokalemia  -Replace K., check magnesium Anemia, unspecified  -Likely chronic, no gross bleeding reported follow recheck.  -Hemoglobin stable Alzheimer disease  -Continue Exelon  Left axis deviation  -She is chest pain-free and previous EKGs show left anterior fascicular block but no left axis deviation noted  -Cardiac enzymes negative.    Code Status: Full Family Communication: None at bedside Disposition Plan: SNF when medically ready   Consultants:  Orthopedics  Procedures:  s/p right intramedullary nail intertrochanteric-on 2/7 per Dr. August Saucerean  Antibiotics:  Clindamycin x2 doses started 2/7  HPI/Subjective: Pleasantly confused, no complaints reported  Objective: Filed Vitals:   08/19/13 1600  BP:   Pulse:   Temp:   Resp: 18    Intake/Output Summary (Last 24 hours) at 08/19/13 1839 Last data filed at 08/19/13 0730  Gross per 24 hour  Intake 1608.75 ml  Output    700 ml  Net 908.75 ml   Filed Weights   08/18/13 1607  Weight: 55.929 kg (123 lb 4.8 oz)    Exam:  General: alert & disoriented/pleasantly confused, In NAD Cardiovascular: RRR, nl S1 s2 Respiratory: CTAB Abdomen: soft +BS NT/ND, no masses palpable Extremities: No cyanosis and no edema    Data Reviewed: Basic Metabolic Panel:  Recent Labs Lab 08/18/13 1226 08/19/13 0511  NA 137 138  K 3.2* 2.9*  CL 102 104  CO2 25 20  GLUCOSE 180* 167*  BUN 20 18  CREATININE 0.60 0.50  CALCIUM 8.4 7.4*   Liver Function Tests:  Recent Labs Lab 08/18/13 1226  AST 37  ALT 22  ALKPHOS 142*  BILITOT 0.3  PROT  5.8*  ALBUMIN 2.8*   No results found for this basename: LIPASE, AMYLASE,  in the last 168 hours No results found for this basename: AMMONIA,  in the last 168 hours CBC:  Recent Labs Lab 08/18/13 1226 08/19/13 0511  WBC 4.8 6.9  NEUTROABS 3.1  --   HGB 10.9* 10.2*  HCT 31.6* 29.9*  MCV 97.8 97.7  PLT 245 256   Cardiac Enzymes:  Recent Labs Lab 08/18/13 1735 08/18/13 2322 08/19/13 0511  TROPONINI <0.30 <0.30 <0.30   BNP (last 3 results) No results found for this basename: PROBNP,  in the last 8760 hours CBG:  Recent Labs Lab 08/18/13 2149 08/19/13 0158 08/19/13 0721 08/19/13 1231 08/19/13 1710  GLUCAP 155* 145* 152* 136* 132*    No results found for this or any previous visit (from the past 240 hour(s)).   Studies: Dg Chest 1 View  08/18/2013   CLINICAL DATA:  Fall, a hip fracture  EXAM: CHEST - 1 VIEW  COMPARISON:  Prior chest x-ray 06/15/2013  FINDINGS: Stable cardiomegaly. Atherosclerotic and ectatic thoracic aorta. Likely ectasia of the ascending segment. Low inspiratory volumes with mild bibasilar atelectasis. Chronic background bronchitic and interstitial changes. The bones appear osteopenic. No acute fracture or malalignment. Degenerative changes noted throughout the thoracic spine.  IMPRESSION: Low inspiratory volumes with mild bibasilar atelectasis.  Otherwise, stable chest x-ray.   Electronically Signed   By: Malachy MoanHeath  McCullough M.D.   On: 08/18/2013 12:59   Dg Hip 1 View Right  08/19/2013   CLINICAL DATA:  Oval  ORIF right hip fracture.  EXAM: RIGHT HIP - 1 VIEW  COMPARISON:  08/18/2013  FINDINGS: Intraoperative spot films of the right femur are submitted postoperatively for interpretation.  There has been interval placement of a medullary nail and screw traversing a right intertrochanteric femur fracture, in near-anatomic alignment and position. No definite complicating features are identified.  IMPRESSION: Internal fixation of right intertrochanteric femur  fracture, in near anatomic alignment and position.   Electronically Signed   By: Laveda Abbe M.D.   On: 08/19/2013 01:16   Dg Hip Complete Right  08/18/2013   CLINICAL DATA:  Fall and hip pain.  EXAM: RIGHT HIP - COMPLETE 2+ VIEW  COMPARISON:  Left hip radiographs 05/24/2013  FINDINGS: There is a mildly displaced intertrochanteric fracture of the right femur with varus angulation. There is no dislocation. Prior left hip arthroplasty is again identified. Left femoral prosthesis remains approximated with the acetabulum. The bones are diffusely osteopenic. Degenerative changes are noted in the visualized lower lumbar spine.  IMPRESSION: Mildly displaced and angulated intertrochanteric right femur fracture.   Electronically Signed   By: Sebastian Ache   On: 08/18/2013 12:59   Ct Head Wo Contrast  08/18/2013   CLINICAL DATA:  History of dementia, status post fall now with headache and neck pain  EXAM: CT HEAD WITHOUT CONTRAST  CT CERVICAL SPINE WITHOUT CONTRAST  TECHNIQUE: Multidetector CT imaging of the head and cervical spine was performed following the standard protocol without intravenous contrast. Multiplanar CT image reconstructions of the cervical spine were also generated.  COMPARISON:  CT HEAD W/O CM dated 07/20/2011  FINDINGS: CT HEAD FINDINGS  There is moderate diffuse cerebral and cerebellar atrophy with compensatory ventriculomegaly. There is no evidence of an acute intracranial hemorrhage nor of an evolving ischemic infarction. There is decreased density in the deep white matter of both cerebral hemispheres consistent with chronic small vessel ischemic type change. The cerebellum and brainstem are normal in density. There are no abnormal intracranial calcifications.  At bone window settings the observed portions of the paranasal sinuses and mastoid air cells are clear. There is no cephalohematoma nor evidence of an acute skull fracture.  CT CERVICAL SPINE FINDINGS  There is mild loss of the normal cervical  lordosis. The cervical vertebral bodies are preserved in height. There is mild disc space narrowing at C5-6 and at C6-7. Small endplate osteophytes are present at these levels. The prevertebral soft tissue spaces appear normal. There is no evidence of a perched facet or spinous process fracture. There is mild facet joint degenerative change. The bony ring at each cervical level is intact. The odontoid is intact and the lateral masses of C1 align normally with those of C2.  IMPRESSION: 1. There is no evidence of an acute intracranial hemorrhage nor other acute intracranial abnormality. 2. There is moderate diffuse cerebral and cerebellar atrophy with compensatory ventriculomegaly. This has progressed slightly since the previous study. 3. There are findings consistent with chronic small vessel ischemic type change. 4. There is mild degenerative change of the cervical spine, but there is no evidence of an acute fracture nor dislocation.   Electronically Signed   By: David  [place]   On: 08/18/2013 12:56   Ct Cervical Spine Wo Contrast  08/18/2013   CLINICAL DATA:  History of dementia, status post fall now with headache and neck pain  EXAM: CT HEAD WITHOUT CONTRAST  CT CERVICAL SPINE WITHOUT CONTRAST  TECHNIQUE: Multidetector CT imaging of the head and cervical spine was performed  following the standard protocol without intravenous contrast. Multiplanar CT image reconstructions of the cervical spine were also generated.  COMPARISON:  CT HEAD W/O CM dated 07/20/2011  FINDINGS: CT HEAD FINDINGS  There is moderate diffuse cerebral and cerebellar atrophy with compensatory ventriculomegaly. There is no evidence of an acute intracranial hemorrhage nor of an evolving ischemic infarction. There is decreased density in the deep white matter of both cerebral hemispheres consistent with chronic small vessel ischemic type change. The cerebellum and brainstem are normal in density. There are no abnormal intracranial calcifications.   At bone window settings the observed portions of the paranasal sinuses and mastoid air cells are clear. There is no cephalohematoma nor evidence of an acute skull fracture.  CT CERVICAL SPINE FINDINGS  There is mild loss of the normal cervical lordosis. The cervical vertebral bodies are preserved in height. There is mild disc space narrowing at C5-6 and at C6-7. Small endplate osteophytes are present at these levels. The prevertebral soft tissue spaces appear normal. There is no evidence of a perched facet or spinous process fracture. There is mild facet joint degenerative change. The bony ring at each cervical level is intact. The odontoid is intact and the lateral masses of C1 align normally with those of C2.  IMPRESSION: 1. There is no evidence of an acute intracranial hemorrhage nor other acute intracranial abnormality. 2. There is moderate diffuse cerebral and cerebellar atrophy with compensatory ventriculomegaly. This has progressed slightly since the previous study. 3. There are findings consistent with chronic small vessel ischemic type change. 4. There is mild degenerative change of the cervical spine, but there is no evidence of an acute fracture nor dislocation.   Electronically Signed   By: David  [place]   On: 08/18/2013 12:56   Dg Pelvis Portable  08/19/2013   CLINICAL DATA:  80 year old female status post right hip repair. Initial encounter.  EXAM: PORTABLE PELVIS 1-2 VIEWS  COMPARISON:  0113 hr the same day and earlier.  FINDINGS: Portable AP view. Right femur intra medullary rod partially visible. Proximal interlocking dynamic hip screw. Overlying postoperative changes to the soft tissues with skin staples in place. Right femoral head remains normally located. Preexisting left bipolar hip arthroplasty. Pelvis appears stable.  IMPRESSION: Right proximal femur ORIF with no adverse   Electronically Signed   By: Augusto Gamble M.D.   On: 08/19/2013 02:26   Dg C-arm 1-60 Min-no Report  08/19/2013    CLINICAL DATA: right hip nail   C-ARM 1-60 MINUTES  Fluoroscopy was utilized by the requesting physician.  No radiographic  interpretation.     Scheduled Meds: . acetaminophen  650 mg Oral TID  . aspirin EC  325 mg Oral Q breakfast  . cholecalciferol  1,000 Units Oral q morning - 10a  . clomiPRAMINE  50 mg Oral BID  . insulin aspart  0-5 Units Subcutaneous QHS  . insulin aspart  0-9 Units Subcutaneous TID WC  . ipratropium-albuterol  3 mL Nebulization BID  . latanoprost  1 drop Both Eyes QHS  . LORazepam  0.25 mg Oral QID  . mirtazapine  15 mg Oral QHS  . pantoprazole  40 mg Oral Daily  . rivastigmine  9.5 mg Transdermal Daily  . sulfamethoxazole-trimethoprim  1 tablet Oral BID   Continuous Infusions: . lactated ringers      Active Problems:   Alzheimer disease   DM type 2 (diabetes mellitus, type 2)   Hip fracture   Hypokalemia   Anemia, unspecified  Hip fracture, right    Time spent: >30    Kela Millin  [facility] Pager [phone]. If 7PM-7AM, please contact night-coverage at [email or web address], password [facility] 08/19/2013, 6:39 PM  LOS: 1 day

## 2013-08-20 LAB — CBC
HCT: 29 % — ABNORMAL LOW (ref 36.0–46.0)
HEMOGLOBIN: 9.8 g/dL — AB (ref 12.0–15.0)
MCH: 33.3 pg (ref 26.0–34.0)
MCHC: 33.8 g/dL (ref 30.0–36.0)
MCV: 98.6 fL (ref 78.0–100.0)
Platelets: 240 10*3/uL (ref 150–400)
RBC: 2.94 MIL/uL — ABNORMAL LOW (ref 3.87–5.11)
RDW: 13.3 % (ref 11.5–15.5)
WBC: 5.4 10*3/uL (ref 4.0–10.5)

## 2013-08-20 LAB — BASIC METABOLIC PANEL
BUN: 11 mg/dL (ref 6–23)
CALCIUM: 8 mg/dL — AB (ref 8.4–10.5)
CO2: 22 mEq/L (ref 19–32)
Chloride: 106 mEq/L (ref 96–112)
Creatinine, Ser: 0.51 mg/dL (ref 0.50–1.10)
GFR calc Af Amer: >90 mL/min (ref >90)
GFR, EST NON AFRICAN AMERICAN: 89 mL/min — AB (ref >90)
GLUCOSE: 159 mg/dL — AB (ref 70–99)
Potassium: 4.3 mEq/L (ref 3.7–5.3)
SODIUM: 138 meq/L (ref 137–147)

## 2013-08-20 LAB — GLUCOSE, CAPILLARY
GLUCOSE-CAPILLARY: 152 mg/dL — AB (ref 70–99)
Glucose-Capillary: 152 mg/dL — ABNORMAL HIGH (ref 70–99)
Glucose-Capillary: 196 mg/dL — ABNORMAL HIGH (ref 70–99)
Glucose-Capillary: 102 mg/dL — ABNORMAL HIGH (ref 70–99)
Glucose-Capillary: 178 mg/dL — ABNORMAL HIGH (ref 70–99)

## 2013-08-20 LAB — PROTIME-INR
INR: 1.23 (ref 0.00–1.49)
Prothrombin Time: 15.2 seconds (ref 11.6–15.2)

## 2013-08-20 LAB — MAGNESIUM: Magnesium: 2 mg/dL (ref 1.5–2.5)

## 2013-08-20 MED ORDER — ASPIRIN 325 MG PO TBEC: 325.0000 mg | DELAYED_RELEASE_TABLET | Freq: Every day | ORAL | Status: DC

## 2013-08-20 MED ORDER — HYDROCODONE-ACETAMINOPHEN 5-325 MG PO TABS: 1.0000 | ORAL_TABLET | Freq: Four times a day (QID) | ORAL | Status: AC | PRN

## 2013-08-20 MED ORDER — SULFAMETHOXAZOLE-TRIMETHOPRIM 200-40 MG/5ML PO SUSP
20.0000 mL | Freq: Two times a day (BID) | ORAL | Status: DC
  Administered 2013-08-20 – 2013-08-22 (×5): 20 mL via ORAL
  Filled 2013-08-20 (×6): qty 20

## 2013-08-20 MED ORDER — ASPIRIN EC 325 MG PO TBEC: 325.0000 mg | DELAYED_RELEASE_TABLET | Freq: Every day | ORAL | Status: AC

## 2013-08-20 MED ORDER — HYDROCODONE-ACETAMINOPHEN 5-325 MG PO TABS: 1.0000 | ORAL_TABLET | Freq: Four times a day (QID) | ORAL | Status: DC | PRN

## 2013-08-20 MED ORDER — CLOMIPRAMINE HCL 25 MG PO CAPS
50.0000 mg | ORAL_CAPSULE | Freq: Two times a day (BID) | ORAL | Status: DC
Start: 2013-08-20 — End: 2013-08-22
  Administered 2013-08-20 – 2013-08-22 (×5): 50 mg via ORAL
  Filled 2013-08-20 (×7): qty 2

## 2013-08-20 MED ORDER — PANTOPRAZOLE SODIUM 40 MG PO PACK
40.0000 mg | PACK | Freq: Every day | ORAL | Status: DC
  Administered 2013-08-20 – 2013-08-22 (×3): 40 mg via ORAL
  Filled 2013-08-20 (×4): qty 20

## 2013-08-20 NOTE — Progress Notes (Addendum)
[facility] PROGRESS NOTE  Michelle Sweeney ZOX:[id number] DOB: 1933-04-26 DOA: 08/18/2013 PCP: Donzetta Sprung, MD  Assessment/Plan: Hip fracture, right  -As discussed above, per orthopedics  -Status post surgery/ intramedullary nail intertrochanteric/ORIF DM type 2 (diabetes mellitus, type 2)  -continue monitoring Accu-Cheks, cover with sliding scale insulin.  -Continue metformin  Hypokalemia  -Resolved, magnesium within normal limits at 2 Anemia, unspecified  -Likely chronic, no gross bleeding reported follow recheck.  -Hemoglobin stable Alzheimer disease  -Continue Exelon  -Continue Ativan as prior to admission, hold for sedation Left axis deviation  -She is chest pain-free and previous EKGs show left anterior fascicular block but no left axis deviation noted  -Cardiac enzymes negative.    Code Status: Full Family Communication: Daughter at bedside Disposition Plan: SNF when bed available   Consultants:  Orthopedics  Procedures:  s/p right intramedullary nail intertrochanteric-on 2/7 per Dr. August Saucer  Antibiotics:  Clindamycin x2 doses started 2/7  HPI/Subjective: Sleeping status post Ativan, per nursing restless overnight. Daughter at bedside  Objective: Filed Vitals:   08/20/13 0800  BP:   Pulse:   Temp:   Resp: 20    Intake/Output Summary (Last 24 hours) at 08/20/13 1535 Last data filed at 08/20/13 1145  Gross per 24 hour  Intake    570 ml  Output   1775 ml  Net  -1205 ml   Filed Weights   08/18/13 1607  Weight: 55.929 kg (123 lb 4.8 oz)    Exam:  General: Sleeping, arousable, In NAD Cardiovascular: RRR, nl S1 s2 Respiratory: CTAB Abdomen: soft +BS NT/ND, no masses palpable Extremities: No cyanosis and no edema    Data Reviewed: Basic Metabolic Panel:  Recent Labs Lab 08/18/13 1226 08/19/13 0511 08/20/13 0519  NA 137 138 138  K 3.2* 2.9* 4.3  CL 102 104 106  CO2 25 20 22   GLUCOSE 180* 167* 159*  BUN 20 18 11   CREATININE 0.60  0.50 0.51  CALCIUM 8.4 7.4* 8.0*  MG  --   --  2.0   Liver Function Tests:  Recent Labs Lab 08/18/13 1226  AST 37  ALT 22  ALKPHOS 142*  BILITOT 0.3  PROT 5.8*  ALBUMIN 2.8*   No results found for this basename: LIPASE, AMYLASE,  in the last 168 hours No results found for this basename: AMMONIA,  in the last 168 hours CBC:  Recent Labs Lab 08/18/13 1226 08/19/13 0511 08/20/13 0519  WBC 4.8 6.9 5.4  NEUTROABS 3.1  --   --   HGB 10.9* 10.2* 9.8*  HCT 31.6* 29.9* 29.0*  MCV 97.8 97.7 98.6  PLT 245 256 240   Cardiac Enzymes:  Recent Labs Lab 08/18/13 1735 08/18/13 2322 08/19/13 0511  TROPONINI <0.30 <0.30 <0.30   BNP (last 3 results) No results found for this basename: PROBNP,  in the last 8760 hours CBG:  Recent Labs Lab 08/19/13 1710 08/19/13 2123 08/20/13 0611 08/20/13 0719 08/20/13 1139  GLUCAP 132* 155* 152* 152* 196*    No results found for this or any previous visit (from the past 240 hour(s)).   Studies: Dg Hip 1 View Right  08/19/2013   CLINICAL DATA:  Oval ORIF right hip fracture.  EXAM: RIGHT HIP - 1 VIEW  COMPARISON:  08/18/2013  FINDINGS: Intraoperative spot films of the right femur are submitted postoperatively for interpretation.  There has been interval placement of a medullary nail and screw traversing a right intertrochanteric femur fracture, in near-anatomic alignment and position. No definite complicating features  are identified.  IMPRESSION: Internal fixation of right intertrochanteric femur fracture, in near anatomic alignment and position.   Electronically Signed   By: Laveda AbbeJeff  Hu M.D.   On: 08/19/2013 01:16   Dg Pelvis Portable  08/19/2013   CLINICAL DATA:  80 year old female status post right hip repair. Initial encounter.  EXAM: PORTABLE PELVIS 1-2 VIEWS  COMPARISON:  0113 hr the same day and earlier.  FINDINGS: Portable AP view. Right femur intra medullary rod partially visible. Proximal interlocking dynamic hip screw. Overlying  postoperative changes to the soft tissues with skin staples in place. Right femoral head remains normally located. Preexisting left bipolar hip arthroplasty. Pelvis appears stable.  IMPRESSION: Right proximal femur ORIF with no adverse   Electronically Signed   By: Augusto GambleLee  Hall M.D.   On: 08/19/2013 02:26   Dg C-arm 1-60 Min-no Report  08/19/2013   CLINICAL DATA: right hip nail   C-ARM 1-60 MINUTES  Fluoroscopy was utilized by the requesting physician.  No radiographic  interpretation.     Scheduled Meds: . acetaminophen  650 mg Oral TID  . aspirin EC  325 mg Oral Q breakfast  . cholecalciferol  1,000 Units Oral q morning - 10a  . clomiPRAMINE  50 mg Oral BID WC  . insulin aspart  0-5 Units Subcutaneous QHS  . insulin aspart  0-9 Units Subcutaneous TID WC  . ipratropium-albuterol  3 mL Nebulization BID  . latanoprost  1 drop Both Eyes QHS  . LORazepam  0.25 mg Oral QID  . metFORMIN  250 mg Oral Q supper  . mirtazapine  15 mg Oral QHS  . pantoprazole sodium  40 mg Oral Daily  . rivastigmine  9.5 mg Transdermal Daily  . sulfamethoxazole-trimethoprim  20 mL Oral Q12H   Continuous Infusions: . lactated ringers      Active Problems:   Alzheimer disease   DM type 2 (diabetes mellitus, type 2)   Hip fracture   Hypokalemia   Anemia, unspecified   Hip fracture, right    Time spent: >25    Kela MillinVIYUOH,Wolfe Camarena C  [facility] Pager [phone]. If 7PM-7AM, please contact night-coverage at [email or web address], password [email or web address] 08/20/2013, 3:35 PM  LOS: 2 days

## 2013-08-20 NOTE — Op Note (Signed)
NAME:  Erick AlleySMITH, Janine                ACCOUNT NO.:  [id number]  MEDICAL RECORD NO.:  [id number]  LOCATION:  1605                         [facility]  PHYSICIAN:  Burnard BuntingG. Scott Laniece Hornbaker, M.D.    DATE OF BIRTH:  03/17/1933  DATE OF PROCEDURE: DATE OF DISCHARGE:                              OPERATIVE REPORT   PREOPERATIVE DIAGNOSIS:  Right hip intertrochanteric fracture.  POSTOPERATIVE DIAGNOSIS:  Right hip intertrochanteric fracture.  PROCEDURE:  Right hip intertrochanteric fracture, open reduction and internal fixation.  SURGEON:  Burnard BuntingG. Scott Sudiksha Victor, M.D.  ASSISTANT:  None.  ANESTHESIA:  Spinal.  ESTIMATED BLOOD LOSS:  100 mL.  DRAINS:  None.  INDICATION:  Artis FlockJudith Converse is a 80 year old ambulatory patient with right hip fracture who presents for operative management after explanation of risks and benefits.  COMPONENTS UTILIZED:  [place] [employer] 36 x 10 mm nail with 80 mm CHS lag screw.  PROCEDURE IN DETAIL:  The patient was brought to the operating room, where a spinal anesthetic was induced.  Preop antibiotics administered. Time-out was called.  Right leg was prescrubbed with alcohol and Betadine, which was allowed to dry.  Prepped with DuraPrep solution and draped in sterile manner.  [place] __________ drape was utilized.  Left leg was carefully placed in abduction, external rotation, and hip flexion.  Under fluoroscopic guidance, the size of the nail was determined.  Fracture reduction was achieved with traction and mild internal rotation.  Guide pin and incision was then made with 4 fingerbreadths proximal to the tip of the trochanter.  Skin and subcutaneous tissue was sharply divided.  The guide rod was then placed through the tip of the trochanter into the shaft.  Proximal reaming was performed under direct visualization and fluoroscopic guidance.  The nail was then placed.  Separate incision was then used and an 80 mm compression screw was placed with good reduction of the  fracture achieved.  Compression screw was placed within the lag screw in order to achieve fuller compression.  The construct was stable.  Fracture moved to the unit.  Jig was removed. Both incisions were irrigated and closed using 0 Vicryl suture, 2-0 Vicryl suture, and skin staples.  Dressing was applied.  The patient tolerated the procedure well without immediate complications. Transferred to recovery room in stable condition.     Burnard BuntingG. Scott Dhanya Bogle, M.D.     GSD/MEDQ  D:  08/19/2013  T:  08/20/2013  Job:  [id number]

## 2013-08-20 NOTE — Progress Notes (Signed)
Pt stable - pain looks controlled Decreased pain with leg rom Plan dc to snf when available Change dressing monday

## 2013-08-21 ENCOUNTER — Encounter (HOSPITAL_COMMUNITY): Payer: Self-pay | Admitting: Orthopedic Surgery

## 2013-08-21 DIAGNOSIS — IMO0002 Reserved for concepts with insufficient information to code with codable children: Secondary | ICD-10-CM

## 2013-08-21 LAB — PROTIME-INR
INR: 1.11 (ref 0.00–1.49)
Prothrombin Time: 14.1 seconds (ref 11.6–15.2)

## 2013-08-21 LAB — GLUCOSE, CAPILLARY
GLUCOSE-CAPILLARY: 131 mg/dL — AB (ref 70–99)
GLUCOSE-CAPILLARY: 64 mg/dL — AB (ref 70–99)
Glucose-Capillary: 251 mg/dL — ABNORMAL HIGH (ref 70–99)
Glucose-Capillary: 189 mg/dL — ABNORMAL HIGH (ref 70–99)

## 2013-08-21 LAB — CBC
HCT: 27.7 % — ABNORMAL LOW (ref 36.0–46.0)
HEMOGLOBIN: 9.3 g/dL — AB (ref 12.0–15.0)
MCH: 33.1 pg (ref 26.0–34.0)
MCHC: 33.6 g/dL (ref 30.0–36.0)
MCV: 98.6 fL (ref 78.0–100.0)
PLATELETS: 251 10*3/uL (ref 150–400)
RBC: 2.81 MIL/uL — AB (ref 3.87–5.11)
RDW: 13.4 % (ref 11.5–15.5)
WBC: 5.1 10*3/uL (ref 4.0–10.5)

## 2013-08-21 NOTE — Progress Notes (Signed)
[facility] PROGRESS NOTE  Arcola JanskyJudith M Urquilla WUJ:[id number] DOB: 01/02/1933 DOA: 08/18/2013 PCP: Donzetta SprungANIEL, TERRY, MD  Assessment/Plan: Hip fracture, right  -As discussed above, per orthopedics  -Status post surgery/ intramedullary nail intertrochanteric/ORIF -Awaiting SNF >> likely in the a.m. DM type 2 (diabetes mellitus, type 2)  -continue monitoring Accu-Cheks, cover with sliding scale insulin.  -Continue metformin  Hypokalemia  -Resolved, magnesium within normal limits at 2 Anemia, unspecified  -Likely chronic, no gross bleeding reported follow recheck.  -Hemoglobin stable Alzheimer disease  -Continue Exelon  -Continue Ativan as prior to admission, hold for sedation Left axis deviation  -She is chest pain-free and previous EKGs show left anterior fascicular block but no left axis deviation noted  -Cardiac enzymes negative.    Code Status: Full Family Communication: None bedside Disposition Plan: SNF when bed available   Consultants:  Orthopedics  Procedures:  s/p right intramedullary nail intertrochanteric-on 2/7 per Dr. August Saucerean  Antibiotics:  Clindamycin x2 doses started 2/7  HPI/Subjective: Much more alert today, no new complaints reported.  Objective: Filed Vitals:   08/21/13 1356  BP: 100/53  Pulse: 100  Temp: 98.8 F (37.1 C)  Resp: 18    Intake/Output Summary (Last 24 hours) at 08/21/13 1857 Last data filed at 08/21/13 1700  Gross per 24 hour  Intake    900 ml  Output    900 ml  Net      0 ml   Filed Weights   08/18/13 1607  Weight: 55.929 kg (123 lb 4.8 oz)    Exam:  General: Sleeping, arousable, In NAD Cardiovascular: RRR, nl S1 s2 Respiratory: CTAB Abdomen: soft +BS NT/ND, no masses palpable Extremities: No cyanosis and no edema    Data Reviewed: Basic Metabolic Panel:  Recent Labs Lab 08/18/13 1226 08/19/13 0511 08/20/13 0519  NA 137 138 138  K 3.2* 2.9* 4.3  CL 102 104 106  CO2 25 20 22   GLUCOSE 180* 167* 159*  BUN  20 18 11   CREATININE 0.60 0.50 0.51  CALCIUM 8.4 7.4* 8.0*  MG  --   --  2.0   Liver Function Tests:  Recent Labs Lab 08/18/13 1226  AST 37  ALT 22  ALKPHOS 142*  BILITOT 0.3  PROT 5.8*  ALBUMIN 2.8*   No results found for this basename: LIPASE, AMYLASE,  in the last 168 hours No results found for this basename: AMMONIA,  in the last 168 hours CBC:  Recent Labs Lab 08/18/13 1226 08/19/13 0511 08/20/13 0519 08/21/13 0411  WBC 4.8 6.9 5.4 5.1  NEUTROABS 3.1  --   --   --   HGB 10.9* 10.2* 9.8* 9.3*  HCT 31.6* 29.9* 29.0* 27.7*  MCV 97.8 97.7 98.6 98.6  PLT 245 256 240 251   Cardiac Enzymes:  Recent Labs Lab 08/18/13 1735 08/18/13 2322 08/19/13 0511  TROPONINI <0.30 <0.30 <0.30   BNP (last 3 results) No results found for this basename: PROBNP,  in the last 8760 hours CBG:  Recent Labs Lab 08/20/13 1820 08/20/13 2202 08/21/13 0842 08/21/13 1229 08/21/13 1705  GLUCAP 102* 178* 131* 251* 64*    No results found for this or any previous visit (from the past 240 hour(s)).   Studies: No results found.  Scheduled Meds: . acetaminophen  650 mg Oral TID  . aspirin EC  325 mg Oral Q breakfast  . cholecalciferol  1,000 Units Oral q morning - 10a  . clomiPRAMINE  50 mg Oral BID WC  . insulin aspart  0-5 Units Subcutaneous QHS  . insulin aspart  0-9 Units Subcutaneous TID WC  . ipratropium-albuterol  3 mL Nebulization BID  . latanoprost  1 drop Both Eyes QHS  . LORazepam  0.25 mg Oral QID  . metFORMIN  250 mg Oral Q supper  . mirtazapine  15 mg Oral QHS  . pantoprazole sodium  40 mg Oral Daily  . rivastigmine  9.5 mg Transdermal Daily  . sulfamethoxazole-trimethoprim  20 mL Oral Q12H   Continuous Infusions: . lactated ringers      Active Problems:   Alzheimer disease   DM type 2 (diabetes mellitus, type 2)   Hip fracture   Hypokalemia   Anemia, unspecified   Hip fracture, right    Time spent: >25    Kela Millin  [facility] Pager [phone]. If 7PM-7AM, please contact night-coverage at [email or web address], password [facility] 08/21/2013, 6:57 PM  LOS: 3 days

## 2013-08-21 NOTE — Progress Notes (Signed)
Physical Therapy Treatment Patient Details Name: Michelle JanskyJudith M Mcgourty MRN: [id number] DOB: 11/10/1932 Today's Date: 08/21/2013 Time: [id number] PT Time Calculation (min): 11 min  PT Assessment / Plan / Recommendation  History of Present Illness R femur fx, s/p IM nail, PWB 25%   PT Comments   **Bed to recliner with +2 total assist. PROM to RLE. Dementia limits pt participation with PT.*  Follow Up Recommendations  SNF     Does the patient have the potential to tolerate intense rehabilitation     Barriers to Discharge        Equipment Recommendations  None recommended by PT    Recommendations for Other Services OT consult  Frequency Min 3X/week   Progress towards PT Goals Progress towards PT goals: Not progressing toward goals - comment  Plan Current plan remains appropriate    Precautions / Restrictions Precautions Precautions: Fall Restrictions Weight Bearing Restrictions: Yes RLE Weight Bearing: Partial weight bearing RLE Partial Weight Bearing Percentage or Pounds: 25% Other Position/Activity Restrictions: Pt with advanced dementia and unable to follow PWB   Pertinent Vitals/Pain *Grimaced with movement to RLE Repositioned, rest**    Mobility  Bed Mobility Overal bed mobility: +2 for physical assistance;+ 2 for safety/equipment General bed mobility comments: cues for sequence and use of L LE to self assist.  Pad utilized to bring pt to EOB and total assist to move supine to sit. Transfers Overall transfer level: Needs assistance Transfers: Sit to/from Stand;Stand Pivot Transfers Sit to Stand: +2 physical assistance;+2 safety/equipment;Total assist Stand pivot transfers: +2 physical assistance;+2 safety/equipment;Total assist General transfer comment: cues for  LE managementt.  Pt following min cues.  WB ltd only 2* discomfort not pt understanding of PWB 25%    Exercises General Exercises - Lower Extremity Ankle Circles/Pumps: AAROM;Both;10 reps;Supine Heel Slides:  PROM;Right;15 reps;Supine Hip ABduction/ADduction: PROM;Right;Supine;15 reps   PT Diagnosis:    PT Problem List:   PT Treatment Interventions:     PT Goals (current goals can now be found in the care plan section) Acute Rehab PT Goals PT Goal Formulation: Patient unable to participate in goal setting Time For Goal Achievement: 09/02/13 Potential to Achieve Goals: Poor  Visit Information  Last PT Received On: 08/21/13 Assistance Needed: +2 History of Present Illness: R femur fx, s/p IM nail, PWB 25%    Subjective Data      Cognition  Cognition Arousal/Alertness: Awake/alert Behavior During Therapy: WFL for tasks assessed/performed Overall Cognitive Status: History of cognitive impairments - at baseline    Balance     End of Session PT - End of Session Equipment Utilized During Treatment: Gait belt Activity Tolerance: Patient limited by pain;Other (comment) (dementia, decreased ability to follow directions) Patient left: with call bell/phone within reach;in chair;with chair alarm set Nurse Communication: Mobility status   GP     Ralene BatheUhlenberg, Namir Neto Kistler 08/21/2013, 11:55 AM [phone]

## 2013-08-22 LAB — PROTIME-INR
INR: 1.01 (ref 0.00–1.49)
Prothrombin Time: 13.1 seconds (ref 11.6–15.2)

## 2013-08-22 LAB — GLUCOSE, CAPILLARY
Glucose-Capillary: 211 mg/dL — ABNORMAL HIGH (ref 70–99)
Glucose-Capillary: 82 mg/dL (ref 70–99)
Glucose-Capillary: 187 mg/dL — ABNORMAL HIGH (ref 70–99)

## 2013-08-22 MED ORDER — SULFAMETHOXAZOLE-TMP DS 800-160 MG PO TABS: 1.0000 | ORAL_TABLET | Freq: Two times a day (BID) | ORAL | Status: AC

## 2013-08-22 MED ORDER — BISACODYL 10 MG RE SUPP: 10.0000 mg | Freq: Every day | RECTAL | Status: AC | PRN

## 2013-08-22 NOTE — Progress Notes (Signed)
RN attempted to call report 2 times, and was put on hold for almost 10 minutes during both phone calls.   [facility].

## 2013-08-22 NOTE — Discharge Summary (Signed)
Physician Discharge Summary  Michelle Sweeney ZOX:[id number] DOB: 08/02/1932 DOA: 08/18/2013  PCP: Donzetta Sprung, MD  Admit date: 08/18/2013 Discharge date: 08/22/2013  Time spent: >30 minutes  Recommendations for Outpatient Follow-up:  Follow-up Information   Please follow up. (SNF md IN 1-2days)       Follow up with Cammy Copa, MD. (in 2weeks, call for appt upon discharge)    Specialty:  Orthopedic Surgery   Contact information:   [address] [place] [address] [phone]        Discharge Diagnoses:  Active Problems:   Alzheimer disease   DM type 2 (diabetes mellitus, type 2)   Hip fracture   Hypokalemia   Anemia, unspecified   Hip fracture, right   Discharge Condition: improved/stable  Diet recommendation: Modified carbohydrate  Filed Weights   08/18/13 1607  Weight: 55.929 kg (123 lb 4.8 oz)    History of present illness:  Michelle Sweeney is a 80 y.o. female nursing home/memory care unit resident with history of Alzheimer's dementia, hypertension, diabetes mellitus, osteoporosis, recent Foley started on Bactrim for UTI who presents with above complaints. She was seen in the ED and right hip x-ray showed a mildly displaced and angulated intertrochanteric right femur fracture, chest x-ray showed no acute infiltrates, and EKG showed left axis deviation, normal sinus rhythm. Patient denies chest pain, shortness of breath, fevers. Orthopedics was consulted in the ED and admission to medicine service requested for further management.   Hospital Course:  Hip fracture, right  -As discussed above, upon admission orthopedics was consulted and Dr. August Saucer saw the patient and she was taken to oral and 2/7  -Status post surgery/ intramedullary nail intertrochanteric/ORIF  -Postop she was placed on aspirin for was the DVT prophylaxis per orthopedics -PT was consulted and followed patient while in the hospital -Patient medically stable for discharge to nursing  home -She is to followup with Dr. August Saucer in 2 weeks DM type 2 (diabetes mellitus, type 2)  -Accu-Cheks were monitored and she was "sliding scale insulin while in the hospital. -Continue metformin upon discharge Hypokalemia  -Resolved,Her potassium was replaced to the hospital. magnesium within normal limits at 2  Anemia, unspecified  -Likely chronic, no gross bleeding -Hemoglobin stable - 9.3 per prior to discharge Alzheimer disease  -Continue Exelon  -Continue Ativan as prior to admission, hold for sedation  Left axis deviation  -She is chest pain-free and previous EKGs showED left anterior fascicular block but no left axis deviation noted  -Cardiac enzymes negative.  Consultants:  Orthopedics Procedures:  s/p right intramedullary nail intertrochanteric-on 2/7 per Dr. August Saucer    Exam:  General: Sleeping, arousable, In NAD  Cardiovascular: RRR, nl S1 s2  Respiratory: CTAB  Abdomen: soft +BS NT/ND, no masses palpable  Extremities: No cyanosis and no edema    Discharge Exam: Filed Vitals:   08/22/13 1345  BP: 106/71  Pulse: 109  Temp: 98.3 F (36.8 C)  Resp: 16     Discharge Instructions  Discharge Orders   Future Orders Complete By Expires   Diet Carb Modified  As directed    Increase activity slowly  As directed    Touch down weight bearing  As directed    Questions:     Laterality:     Extremity:         Medication List         acetaminophen 325 MG tablet  Commonly known as:  TYLENOL  Take 650 mg by mouth 3 (three)  times daily.     albuterol 0.63 MG/3ML nebulizer solution  Commonly known as:  ACCUNEB  Take 1 ampule by nebulization 2 (two) times daily.     ALPRAZolam 0.25 MG tablet  Commonly known as:  XANAX  Take 0.25 mg by mouth every 8 (eight) hours as needed for anxiety.     aspirin EC 325 MG tablet  Take 1 tablet (325 mg total) by mouth daily.     bimatoprost 0.01 % Soln  Commonly known as:  LUMIGAN  Place 1 drop into both eyes at bedtime.      bisacodyl 10 MG suppository  Commonly known as:  DULCOLAX  Place 1 suppository (10 mg total) rectally daily as needed for moderate constipation.     cholecalciferol 1000 UNITS tablet  Commonly known as:  VITAMIN D  Take 1,000 Units by mouth every morning.     clomiPRAMINE 50 MG capsule  Commonly known as:  ANAFRANIL  Take 50 mg by mouth 2 (two) times daily.     ENSURE PLUS Liqd  Take 237 mLs by mouth 4 (four) times daily -  with meals and at bedtime.     feeding supplement (PRO-STAT SUGAR FREE 64) Liqd  Take 30 mLs by mouth 3 (three) times daily with meals.     HYDROcodone-acetaminophen 5-325 MG per tablet  Commonly known as:  NORCO/VICODIN  Take 1-2 tablets by mouth every 6 (six) hours as needed for moderate pain.     ipratropium 0.02 % nebulizer solution  Commonly known as:  ATROVENT  Take 0.5 mg by nebulization every 6 (six) hours.     LORazepam 0.5 MG tablet  Commonly known as:  ATIVAN  Take 0.25 mg by mouth 4 (four) times daily.     metFORMIN 500 MG tablet  Commonly known as:  GLUCOPHAGE  Take 250 mg by mouth every evening.     mirtazapine 15 MG tablet  Commonly known as:  REMERON  Take 15 mg by mouth at bedtime.     omeprazole 40 MG capsule  Commonly known as:  PRILOSEC  Take 40 mg by mouth every morning.     RISA-BID PROBIOTIC PO  Take 1 tablet by mouth every morning.     rivastigmine 9.5 mg/24hr  Commonly known as:  EXELON  Place 1 patch onto the skin daily. Remove used patches before applying a new patch     sulfamethoxazole-trimethoprim 800-160 MG per tablet  Commonly known as:  BACTRIM DS  Take 1 tablet by mouth 2 (two) times daily.     SYSTANE OP  Place 1 drop into the right eye 3 (three) times daily.     traMADol 50 MG tablet  Commonly known as:  ULTRAM  Take 50 mg by mouth See admin instructions. Take 1 tablet twice a day. May take 1 additional tablet every 6 hours as needed for pain       Allergies  Allergen Reactions  . Penicillins  Other (See Comments)    Reaction unknown  . Quinine Derivatives Other (See Comments)    Reaction unknown       Follow-up Information   Please follow up. (SNF md IN 1-2days)       Follow up with Cammy Copa, MD. (in 2weeks, call for appt upon discharge)    Specialty:  Orthopedic Surgery   Contact information:   [address] [address] [place] [address] [phone]        The results of significant diagnostics from this hospitalization (  including imaging, microbiology, ancillary and laboratory) are listed below for reference.    Significant Diagnostic Studies: Dg Chest 1 View  08/18/2013   CLINICAL DATA:  Fall, a hip fracture  EXAM: CHEST - 1 VIEW  COMPARISON:  Prior chest x-ray 06/15/2013  FINDINGS: Stable cardiomegaly. Atherosclerotic and ectatic thoracic aorta. Likely ectasia of the ascending segment. Low inspiratory volumes with mild bibasilar atelectasis. Chronic background bronchitic and interstitial changes. The bones appear osteopenic. No acute fracture or malalignment. Degenerative changes noted throughout the thoracic spine.  IMPRESSION: Low inspiratory volumes with mild bibasilar atelectasis.  Otherwise, stable chest x-ray.   Electronically Signed   By: Malachy Moan M.D.   On: 08/18/2013 12:59   Dg Hip 1 View Right  08/19/2013   CLINICAL DATA:  Oval ORIF right hip fracture.  EXAM: RIGHT HIP - 1 VIEW  COMPARISON:  08/18/2013  FINDINGS: Intraoperative spot films of the right femur are submitted postoperatively for interpretation.  There has been interval placement of a medullary nail and screw traversing a right intertrochanteric femur fracture, in near-anatomic alignment and position. No definite complicating features are identified.  IMPRESSION: Internal fixation of right intertrochanteric femur fracture, in near anatomic alignment and position.   Electronically Signed   By: Laveda Abbe M.D.   On: 08/19/2013 01:16   Dg Hip Complete Right  08/18/2013   CLINICAL DATA:   Fall and hip pain.  EXAM: RIGHT HIP - COMPLETE 2+ VIEW  COMPARISON:  Left hip radiographs 05/24/2013  FINDINGS: There is a mildly displaced intertrochanteric fracture of the right femur with varus angulation. There is no dislocation. Prior left hip arthroplasty is again identified. Left femoral prosthesis remains approximated with the acetabulum. The bones are diffusely osteopenic. Degenerative changes are noted in the visualized lower lumbar spine.  IMPRESSION: Mildly displaced and angulated intertrochanteric right femur fracture.   Electronically Signed   By: Sebastian Ache   On: 08/18/2013 12:59   Ct Head Wo Contrast  08/18/2013   CLINICAL DATA:  History of dementia, status post fall now with headache and neck pain  EXAM: CT HEAD WITHOUT CONTRAST  CT CERVICAL SPINE WITHOUT CONTRAST  TECHNIQUE: Multidetector CT imaging of the head and cervical spine was performed following the standard protocol without intravenous contrast. Multiplanar CT image reconstructions of the cervical spine were also generated.  COMPARISON:  CT HEAD W/O CM dated 07/20/2011  FINDINGS: CT HEAD FINDINGS  There is moderate diffuse cerebral and cerebellar atrophy with compensatory ventriculomegaly. There is no evidence of an acute intracranial hemorrhage nor of an evolving ischemic infarction. There is decreased density in the deep white matter of both cerebral hemispheres consistent with chronic small vessel ischemic type change. The cerebellum and brainstem are normal in density. There are no abnormal intracranial calcifications.  At bone window settings the observed portions of the paranasal sinuses and mastoid air cells are clear. There is no cephalohematoma nor evidence of an acute skull fracture.  CT CERVICAL SPINE FINDINGS  There is mild loss of the normal cervical lordosis. The cervical vertebral bodies are preserved in height. There is mild disc space narrowing at C5-6 and at C6-7. Small endplate osteophytes are present at these levels.  The prevertebral soft tissue spaces appear normal. There is no evidence of a perched facet or spinous process fracture. There is mild facet joint degenerative change. The bony ring at each cervical level is intact. The odontoid is intact and the lateral masses of C1 align normally with those of C2.  IMPRESSION:  1. There is no evidence of an acute intracranial hemorrhage nor other acute intracranial abnormality. 2. There is moderate diffuse cerebral and cerebellar atrophy with compensatory ventriculomegaly. This has progressed slightly since the previous study. 3. There are findings consistent with chronic small vessel ischemic type change. 4. There is mild degenerative change of the cervical spine, but there is no evidence of an acute fracture nor dislocation.   Electronically Signed   By: David  Swazil[place]   On: 08/18/2013 12:56   Ct Cervical Spine Wo Contrast  08/18/2013   CLINICAL DATA:  History of dementia, status post fall now with headache and neck pain  EXAM: CT HEAD WITHOUT CONTRAST  CT CERVICAL SPINE WITHOUT CONTRAST  TECHNIQUE: Multidetector CT imaging of the head and cervical spine was performed following the standard protocol without intravenous contrast. Multiplanar CT image reconstructions of the cervical spine were also generated.  COMPARISON:  CT HEAD W/O CM dated 07/20/2011  FINDINGS: CT HEAD FINDINGS  There is moderate diffuse cerebral and cerebellar atrophy with compensatory ventriculomegaly. There is no evidence of an acute intracranial hemorrhage nor of an evolving ischemic infarction. There is decreased density in the deep white matter of both cerebral hemispheres consistent with chronic small vessel ischemic type change. The cerebellum and brainstem are normal in density. There are no abnormal intracranial calcifications.  At bone window settings the observed portions of the paranasal sinuses and mastoid air cells are clear. There is no cephalohematoma nor evidence of an acute skull fracture.  CT  CERVICAL SPINE FINDINGS  There is mild loss of the normal cervical lordosis. The cervical vertebral bodies are preserved in height. There is mild disc space narrowing at C5-6 and at C6-7. Small endplate osteophytes are present at these levels. The prevertebral soft tissue spaces appear normal. There is no evidence of a perched facet or spinous process fracture. There is mild facet joint degenerative change. The bony ring at each cervical level is intact. The odontoid is intact and the lateral masses of C1 align normally with those of C2.  IMPRESSION: 1. There is no evidence of an acute intracranial hemorrhage nor other acute intracranial abnormality. 2. There is moderate diffuse cerebral and cerebellar atrophy with compensatory ventriculomegaly. This has progressed slightly since the previous study. 3. There are findings consistent with chronic small vessel ischemic type change. 4. There is mild degenerative change of the cervical spine, but there is no evidence of an acute fracture nor dislocation.   Electronically Signed   By: David  [place]   On: 08/18/2013 12:56   Dg Pelvis Portable  08/19/2013   CLINICAL DATA:  80 year old female status post right hip repair. Initial encounter.  EXAM: PORTABLE PELVIS 1-2 VIEWS  COMPARISON:  0113 hr the same day and earlier.  FINDINGS: Portable AP view. Right femur intra medullary rod partially visible. Proximal interlocking dynamic hip screw. Overlying postoperative changes to the soft tissues with skin staples in place. Right femoral head remains normally located. Preexisting left bipolar hip arthroplasty. Pelvis appears stable.  IMPRESSION: Right proximal femur ORIF with no adverse   Electronically Signed   By: Augusto GambleLee  Hall M.D.   On: 08/19/2013 02:26   Dg C-arm 1-60 Min-no Report  08/19/2013   CLINICAL DATA: right hip nail   C-ARM 1-60 MINUTES  Fluoroscopy was utilized by the requesting physician.  No radiographic  interpretation.     Microbiology: No results found for  this or any previous visit (from the past 240 hour(s)).   Labs: Basic Metabolic  Panel:  Recent Labs Lab 08/18/13 1226 08/19/13 0511 08/20/13 0519  NA 137 138 138  K 3.2* 2.9* 4.3  CL 102 104 106  CO2 25 20 22   GLUCOSE 180* 167* 159*  BUN 20 18 11   CREATININE 0.60 0.50 0.51  CALCIUM 8.4 7.4* 8.0*  MG  --   --  2.0   Liver Function Tests:  Recent Labs Lab 08/18/13 1226  AST 37  ALT 22  ALKPHOS 142*  BILITOT 0.3  PROT 5.8*  ALBUMIN 2.8*   No results found for this basename: LIPASE, AMYLASE,  in the last 168 hours No results found for this basename: AMMONIA,  in the last 168 hours CBC:  Recent Labs Lab 08/18/13 1226 08/19/13 0511 08/20/13 0519 08/21/13 0411  WBC 4.8 6.9 5.4 5.1  NEUTROABS 3.1  --   --   --   HGB 10.9* 10.2* 9.8* 9.3*  HCT 31.6* 29.9* 29.0* 27.7*  MCV 97.8 97.7 98.6 98.6  PLT 245 256 240 251   Cardiac Enzymes:  Recent Labs Lab 08/18/13 1735 08/18/13 2322 08/19/13 0511  TROPONINI <0.30 <0.30 <0.30   BNP: BNP (last 3 results) No results found for this basename: PROBNP,  in the last 8760 hours CBG:  Recent Labs Lab 08/21/13 1229 08/21/13 1705 08/21/13 2054 08/22/13 0728 08/22/13 1149  GLUCAP 251* 64* 189* 211* 82       Signed:  Zayvon Alicea C  [facility] 08/22/2013, 2:29 PM

## 2013-08-23 NOTE — Progress Notes (Signed)
Clinical Social Work Department CLINICAL SOCIAL WORK PLACEMENT NOTE 08/23/2013  Patient:  Michelle Sweeney,Michelle Sweeney  Account Number:  [id number] Admit date:  08/18/2013  Clinical Social Worker:  Doroteo GlassmanAMANDA SIMPSON, LCSWA  Date/time:  08/19/2013 11:49 AM  Clinical Social Work is seeking post-discharge placement for this patient at the following level of care:   SKILLED NURSING   (*CSW will update this form in Epic as items are completed)   08/19/2013  Patient/family provided with [facility] of Clinical Social Work's list of facilities offering this level of care within the geographic area requested by the patient (or if unable, by the patient's family).  08/19/2013  Patient/family informed of their freedom to choose among providers that offer the needed level of care, that participate in Medicare, Medicaid or managed care program needed by the patient, have an available bed and are willing to accept the patient.  08/19/2013  Patient/family informed of [facility]' ownership interest in [facility], as well as of the fact that they are under no obligation to receive care at this facility.  PASARR submitted to [employer] on  PASARR number received from [employer] on   FL2 transmitted to all facilities in geographic area requested by pt/family on  08/19/2013 FL2 transmitted to all facilities within larger geographic area on   Patient informed that his/her managed care company has contracts with or will negotiate with  certain facilities, including the following:     Patient/family informed of bed offers received:  08/22/2013 [facility] Physician recommends and patient chooses bed at    [facility]  08/22/2013 Patient to be transferred to facility by [facility]  The following physician request were entered in Epic:   Additional Comments:  Michelle RazorJamie Jorryn Hershberger LCSW [phone]

## 2013-08-23 NOTE — Addendum Note (Signed)
Addendum created 08/23/13 1938 by Cristela BlueKyle Etheridge Geil, MD   Modules edited: Anesthesia Attestations

## 2013-10-11 DEATH — deceased
